# Patient Record
Sex: Female | Born: 1975 | Race: White | Hispanic: No | Marital: Married | Smoking: Former smoker
Health system: Southern US, Community
[De-identification: ages and names within clinical notes are randomized; demographics above are authoritative.]

## PROBLEM LIST (undated history)

## (undated) DIAGNOSIS — K219 Gastro-esophageal reflux disease without esophagitis: Secondary | ICD-10-CM

## (undated) DIAGNOSIS — IMO0001 Reserved for inherently not codable concepts without codable children: Secondary | ICD-10-CM

## (undated) DIAGNOSIS — F32A Depression, unspecified: Secondary | ICD-10-CM

## (undated) DIAGNOSIS — M1990 Unspecified osteoarthritis, unspecified site: Secondary | ICD-10-CM

## (undated) DIAGNOSIS — R519 Headache, unspecified: Secondary | ICD-10-CM

## (undated) DIAGNOSIS — F329 Major depressive disorder, single episode, unspecified: Secondary | ICD-10-CM

## (undated) HISTORY — DX: Reserved for inherently not codable concepts without codable children: IMO0001

## (undated) HISTORY — PX: HERNIA REPAIR: SHX51

## (undated) HISTORY — DX: Gastro-esophageal reflux disease without esophagitis: K21.9

## (undated) HISTORY — DX: Major depressive disorder, single episode, unspecified: F32.9

## (undated) HISTORY — DX: Depression, unspecified: F32.A

---

## 2004-09-12 ENCOUNTER — Other Ambulatory Visit: Admission: RE | Admit: 2004-09-12 | Discharge: 2004-09-12 | Payer: Self-pay | Admitting: Obstetrics and Gynecology

## 2005-09-28 ENCOUNTER — Inpatient Hospital Stay (HOSPITAL_COMMUNITY): Admission: AD | Admit: 2005-09-28 | Discharge: 2005-09-30 | Payer: Self-pay | Admitting: Obstetrics & Gynecology

## 2006-08-16 ENCOUNTER — Emergency Department (HOSPITAL_COMMUNITY): Admission: EM | Admit: 2006-08-16 | Discharge: 2006-08-16 | Payer: Self-pay | Admitting: Emergency Medicine

## 2007-02-14 ENCOUNTER — Encounter (INDEPENDENT_AMBULATORY_CARE_PROVIDER_SITE_OTHER): Payer: Self-pay | Admitting: Obstetrics and Gynecology

## 2007-02-14 ENCOUNTER — Inpatient Hospital Stay (HOSPITAL_COMMUNITY)
Admission: RE | Admit: 2007-02-14 | Discharge: 2007-02-17 | Payer: BLUE CROSS/BLUE SHIELD | Admitting: Obstetrics and Gynecology

## 2010-11-18 NOTE — Op Note (Signed)
Tiffany Chambers, Tiffany Chambers           ACCOUNT NO.:  [id number]   MEDICAL RECORD NO.:  [id number]          PATIENT TYPE:  INP   LOCATION:  9129                          FACILITY:  [facility]   PHYSICIAN:  Carrington Clamp, M.D. DATE OF BIRTH:  [birth year]   DATE OF PROCEDURE:  02/14/2007  DATE OF DISCHARGE:                               OPERATIVE REPORT   PREOPERATIVE DIAGNOSES:  Repeat cesarean section at term and  multiparous, desires permanent sterility.   POSTOPERATIVE DIAGNOSES:  Repeat cesarean section at term and  multiparous, desires permanent sterility.   PROCEDURE:  Repeat low transverse cesarean section with bilateral tubal  ligation.   SURGEON:  Carrington Clamp, M.D.   ASSISTANT:  Ilda Mori, M.D.   ANESTHESIA:  Spinal.   FINDINGS:  Female infant, vertex presentation, 7 pounds 1 ounce, Apgar's 6  and 7. The baby went to nursery for some transient tachypnea as a  newborn and some fluid. Specimens are left tubal segment and right  fimbriated end of the tube to pathology. Findings were as expected from  previous surgery. Complete uterine didelphys. The left horn of the  uterus was once again pregnant. The right horn was atrophied. The baby  was in the vertex presentation. The left tube was normal in appearance,  the right tube was atrophied and it was difficult to follow the course  back into the cornea of the right horn. The fimbriated end however was  able to be identified and about 4 cm of this was able to be removed. The  ovaries were otherwise normal bilaterally. Kidneys were palpated  bilaterally.   ESTIMATED BLOOD LOSS:  600 mL.   IV FLUIDS:  3500 mL.   URINE OUTPUT:  3000 mL.   COMPLICATIONS:  None.   TECHNIQUE:  After adequate spinal anesthesia was achieved, the patient  was prepped and draped in the usual sterile fashion, dorsal supine  position with leftward tilt. A Pfannenstiel skin incision was made with  the scalpel and carried down to the fascia with  the bovie cautery. The  fascia was incised in the midline with the scalpel and carried in a  transverse curvilinear manner with the Mayo scissors. The fascia was  retracted superior and inferiorly from the rectus muscles with sharp  dissection with the scalpel and the Mayo scissors. The midline was  identified and incised in a vertical fashion with the scalpel until most  of the scar tissue had been gotten through. A hemostat was then used to  carefully and bluntly dissect the peritoneum open. The peritoneum was  incised in a superior and inferior manner with the Mayo scissors. This  was done with good visualization of the bowel and the bladder.   The bladder blade was placed, the vesicouterine fascia tented up and  incised in a transverse curvilinear manner with the Metzenbaum scissors.  A bladder flap was created bluntly and retracted out of the way with the  bladder blade. A 2-cm incision was made in the upper portion of the  lower uterine segment of the left horn until the amnion was identified.  The incision was extended with blunt  dissection and the amnion ruptured.  Clear fluid was noted upon entry and the baby was delivered through the  incision without complications. The baby was bulb suctioned on the field  and the cord was clamped and cut. The baby was handed to awaiting  pediatrics. The baby had been bulb suctioned before, the entire body has  been delivered as well.   The placenta was removed manually and the uterus exteriorized, wrapped  in wet lap, cleared of all debris. The incision was closed with a  running locked stitch of #0 Monocryl. This was followed by an  imbricating layer of #0 Monocryl. Attention was turned to the left hand  tube which was tented up with a Babcock and the avascular portion of the  mesosalpinx was entered into with the bovie cautery. Two free-hand ties  were tied down on each side of the tube creating an intervening segment  of approximately  3cm. The segment was then excised with Metzenbaum  scissors. On the right hand side, the right uterine horn was identified  and followed down through the ovary, the uterine vessels and to the  fimbriated end. There appeared to be an atrophic tube along the veins  but it was difficult to trace the tube from the cornea and to the  fimbriated end. It was then decided to remove the fimbriated end  approximately 4 cm by placing a Tresa Endo over this excising it with the  Metzenbaum's and placing two free hand ties of #0 plain gut. Both of  these segments were sent to pathology.   The uterus was reapproximated in the abdomen and the abdomen cleared of  all debris with irrigation. The uterine incision was reinspected and  found to be hemostatic. The peritoneum was closed with a running stitch  of 2-0 Vicryl. This incorporated the rectus muscles as a separate layer.  A couple of figure-of-eight stitches of 2-0 Vicryl were used to ensure  hemostasis of the rectus muscles. A small amount of oozing was noted  before closure but it appeared to be stable and under control. The  fascia was then closed with a running stitch of #0 Vicryl. The  subcutaneous tissue was rendered hemostatic with bovie cautery and  irrigation. The subcutaneous layer was closed with interrupted stitches  of 2-0 plain gut. The skin was closed with staples. A pressure bandage  was placed and the patient returned to the recovery room in stable  condition.      Carrington Clamp, M.D.  Electronically Signed     MH/MEDQ  D:  02/14/2007  T:  02/15/2007  Job:  [id number]

## 2010-11-21 NOTE — Op Note (Signed)
Tiffany Chambers, Tiffany Chambers           ACCOUNT NO.:  [id number]   MEDICAL RECORD NO.:  [id number]          PATIENT TYPE:  INP   LOCATION:  NA                            FACILITY:  [facility]   PHYSICIAN:  Carrington Clamp, M.D. DATE OF BIRTH:  January 15, 1976   DATE OF PROCEDURE:  09/28/2005  DATE OF DISCHARGE:                                 OPERATIVE REPORT   PREOPERATIVE DIAGNOSES:  1.  Term pregnancy.  2.  Desires repeat cesarean section.   POSTOPERATIVE DIAGNOSES:  1.  Term pregnancy.  2.  Desires repeat cesarean section.  3.  Breech presentation.  4.  Complete uterine didelphys.   PROCEDURE:  Low transverse cesarean section.   SURGEON:  Carrington Clamp, M.D.   ASSISTANT:  Luvenia Redden, M.D.   ANESTHESIA:  Spinal.   SPECIMENS:  None.   ESTIMATED BLOOD LOSS:  750 ml.   IV FLUIDS:  2900 mL.   URINE OUTPUT:  150 mL.   COMPLICATIONS:  None.   FINDINGS:  A female infant, 6 pounds 11 ounces, I believe, with Apgars 9 and  9, in the breech presentation.  There was a complete uterine didelphys with  apparently to cervices felt but not confirmed.  The baby was located in the  left of the uterus, and the right horn appeared to be normal otherwise.  There wee normal ovaries and tubes seen.   MEDICATIONS:  Pitocin and Ancef.   COUNTS:  Correct x3.   TECHNIQUE:  After adequate spinal anesthesia was achieved, the patient is  prepped and draped in the usual sterile fashion in dorsal supine position  with leftward tilt.  A Pfannenstiel skin incision was made with the scalpel,  carried down to the fascia with the Bovie cautery.  The fascia was incised  in the midline with the scalpel and then carried in a transverse curvilinear  manner with the Mayo scissors.  The fascia was reflected carefully  superiorly and inferiorly from the rectus muscles and the rectus muscles  split in the midline with careful dissection with the scalpel.  Once the  peritoneum was identified, it was tented up  and entered into with the  Metzenbaum scissors with good visualization of bowel and the bladder.  The  peritoneum was then opened with good visualization of the bowel and the  bladder with blunt and sharp dissection and the bladder blade placed.  The  vesicouterine fascia was tented up and incised in a transverse curvilinear  manner with the Metzenbaum scissors and the bladder flap created with blunt  dissection and bladder blade replaced.   A 2 cm transverse incision was made in the upper portion lower uterine  segment until clear fluid was noted upon entry.  The bandage scissors were  used to extend the incision bilaterally, although there did not appear to be  as much room there usually is at term in the lower uterine segment.  The  baby was identified in the breech presentation and one leg was delivered,  followed by the of the leg delivered through the incision, and then the baby  delivered in the breech presentation in  the routine fashion.  The baby was  bulb-suctioned, the cord was clamped and cut and the baby was handed to  awaiting pediatrics.  The placenta was then manually removed.  The uterus  was exteriorized, wrapped in a wet lap, and cleared of all debris.   It was at this point that it was noted that although there was a normal tube  and ovary at the top of the fundus on the left-hand side, the right-hand  side of the fundus appeared to be smooth and slightly misshapen.  After  careful inspection, the second uterine horn, which was much smaller, about  the size of a small lime, was noted on the right-hand side and the ovary and  tube coming off of the right ovary and tube coming off of this horn.  It was  difficult to see if there was a connection at the top of the right horn  fundus to the left, but there was a separate lower uterine segment and  cervical length felt down inferiorly on the right-hand side.  Although an  external cervix could not be palpated, it was  believed that this horn has  its own cervix.  The uterine incision was closed with a running locked  stitch of 0 Monocryl.  This was followed by an imbricating layer.  Pictures  were then taken of the uterine didelphys and then the uteri replaced in the  abdominal cavity and the gutters cleared of all debris.  The uterine  incision was found to be hemostatic and all instruments were withdrawn from  the abdomen.   The peritoneum was closed with a running stitch of 2-0 Vicryl.  The fascia  was closed with a running stitch of - Vicryl.  The subcutaneous tissue was  rendered hemostatic with Bovie cautery and irrigation.  This layer was then  closed with interrupted stitches of 2-0 plain gut.  The skin was closed with  staples.  The patient was informed of the findings of the uterine didelphys  and understood.  The patient tolerated the procedure well, was returned to  the recovery room in stable condition.      Carrington Clamp, M.D.  Electronically Signed     MH/MEDQ  D:  09/28/2005  T:  09/29/2005  Job:  [id number]

## 2010-11-21 NOTE — Discharge Summary (Signed)
Tiffany Chambers, Tiffany Chambers           ACCOUNT NO.:  [id number]   MEDICAL RECORD NO.:  [id number]          PATIENT TYPE:  INP   LOCATION:  9111                          FACILITY:  [facility]   PHYSICIAN:  Carrington Clamp, M.D. DATE OF BIRTH:  1976-02-11   DATE OF ADMISSION:  09/28/2005  DATE OF DISCHARGE:  09/30/2005                                 DISCHARGE SUMMARY   FINAL DIAGNOSIS:  Intrauterine pregnancy at term, history of prior cesarean  section.  The patient desires repeat cesarean section, breech presentation,  and complete uterine didelphys.   PROCEDURE:  Low transverse cesarean section. Surgeon, Dr. Carrington Clamp;  assistant, Dr. Lodema Hong.  Complications none.   This 35 year old, G2, P1 presents at term for repeat cesarean section.  The  patient's antepartum course up to this point had been complicated by a low  lying placenta which resolved during her pregnancy.  She was a smoker that  quit with pregnancy.  The patient also had a history of LEEP and a history  of C-section secondary to breech presentation.  The patient also has a  history of depression and she was on Celexa throughout her pregnancy and is  not having any complications.  The patient also had a positive group B strep  culture obtained in the office at 36 weeks.  She is admitted at this time  for repeat cesarean section. She is taken to the operating room on September 28, 2005 by Dr. Carrington Clamp where a repeat low transverse cesarean section  was performed with the delivery of a 6 pound 11 ounces female infant with  Apgar's of 9 and 9.  The baby was in breech presentation at this time and  there was a complete uterine didelphys with apparently two cervices felt but  not confirmed. The baby was in the left uterus and the right one appeared to  be normal otherwise. The patient was given Ancef during the cesarean  section. The rest of the delivery wen without complications.  The patient's  postoperative course was  benign without any significant fevers.  She was  felt ready for discharge on postoperative day #2 and was sent home on a  regular diet, told to decrease activities, told to continue her prenatal  vitamins and an iron supplement was given, Tylox #25 1-2 every 4 hours as  needed for pain. She was to follow up with Dr. Henderson Cloud in the office in 1  week for an incision check, of course to call with increased pain, fever or  bleeding.   LABS ON DISCHARGE:  The patient had a hemoglobin of 9.6, white blood cell  count of 9.4 and platelets of 261,000.      Leilani Able, P.A.-C.      Carrington Clamp, M.D.  Electronically Signed    MB/MEDQ  D:  10/29/2005  T:  10/30/2005  Job:  [id number]

## 2010-11-21 NOTE — Discharge Summary (Signed)
Tiffany Chambers, Tiffany Chambers           ACCOUNT NO.:  [id number]   MEDICAL RECORD NO.:  [id number]          PATIENT TYPE:  INP   LOCATION:  9129                          FACILITY:  [facility]   PHYSICIAN:  Randye Lobo, M.D.   DATE OF BIRTH:  February 11, 1976   DATE OF ADMISSION:  02/14/2007  DATE OF DISCHARGE:  02/17/2007                               DISCHARGE SUMMARY   FINAL DIAGNOSIS:  1. Intrauterine pregnancy at term.  2. History of prior cesarean section.  The patient desires repeat      cesarean section, and the patient desires permanent sterilization.   PROCEDURE:  Repeat low transverse cesarean section with bilateral tubal  ligation.   SURGEON:  Dr. Carrington Clamp.   ASSISTANT:  Dr. Ilda Mori.   COMPLICATIONS:  None.   This 35 year old G3, P 2-0-0-2 presents at term for her repeat cesarean  section.  The patient's antepartum course up to this point had been  complicated by a known complete uterine didelphys.  The patient also has  a history of depression, and she has been on Celexa and was stable  throughout her pregnancy.  She is also on Nexium for some  gastroesophageal reflux.  The patient also had a positive group B strep  culture that was noted in our office at the end of her pregnancy and had  had two prior cesarean sections, secondary to breech presentation.  The  patient was admitted at this time on February 14, 2007, where repeat low  transverse cesarean section was performed with the delivery of a 7-pound  1-ounce female infant with Apgars of 6 and 7.  The baby was taken to the  NICU, was having some tachypnea and some fluid.  After the baby was  taken, bilateral tubal ligation was performed.  The complete uterine  didelphys was noted, and the tubal ligation went without complication.  The patient's postoperative course was benign, without any significant  fevers.  The baby was in the NICU during this time.  The patient was  started on some iron for some mild  postoperative anemia.  The patient  was felt ready for discharge on postoperative day #2.  She did want her  little boy circumcised before discharge.  She was sent home on a regular  diet, told to decrease activities, told to continue her prenatal  vitamins and her iron supplement twice daily.  Was given Percocet one to  two every 4 to 6 hours as needed for pain.  Was told to use ibuprofen up  to 600 mg every 6 hours as needed for pain.  Was to follow up in 2 weeks  for an incision check.   LABS ON DISCHARGE:  The patient had a hemoglobin of 8.7, white blood  cell count of 24.1 on August 12th.  I do not see any other lab work.      Leilani Able, P.A.-C.      Randye Lobo, M.D.  Electronically Signed    MB/MEDQ  D:  03/18/2007  T:  03/19/2007  Job:  [id number]

## 2011-04-20 LAB — CBC
HCT: 24.1 — ABNORMAL LOW
HCT: 27.9 — ABNORMAL LOW
HCT: 32 — ABNORMAL LOW
Hemoglobin: 11.1 — ABNORMAL LOW
Hemoglobin: 8.7 — ABNORMAL LOW
Hemoglobin: 9.7 — ABNORMAL LOW
MCHC: 34.6
MCHC: 34.8
MCHC: 36.2 — ABNORMAL HIGH
MCV: 90.9
MCV: 92
MCV: 92.7
Platelets: 210
Platelets: 240
Platelets: 281
RBC: 2.65 — ABNORMAL LOW
RBC: 3.01 — ABNORMAL LOW
RBC: 3.47 — ABNORMAL LOW
RDW: 12.8
RDW: 12.9
RDW: 13.3
WBC: 10.5
WBC: 8.8
WBC: 8.8

## 2011-04-20 LAB — RAPID HIV SCREEN (WH-MAU): Rapid HIV Screen: NONREACTIVE

## 2011-04-20 LAB — RPR: RPR Ser Ql: NONREACTIVE

## 2013-05-24 ENCOUNTER — Telehealth: Payer: Self-pay | Admitting: *Deleted

## 2013-05-24 ENCOUNTER — Encounter: Payer: Self-pay | Admitting: Podiatry

## 2013-05-24 ENCOUNTER — Ambulatory Visit (INDEPENDENT_AMBULATORY_CARE_PROVIDER_SITE_OTHER): Payer: BC Managed Care – PPO | Admitting: Podiatry

## 2013-05-24 ENCOUNTER — Ambulatory Visit (INDEPENDENT_AMBULATORY_CARE_PROVIDER_SITE_OTHER): Payer: BC Managed Care – PPO

## 2013-05-24 VITALS — BP 123/80 | HR 99 | Resp 16 | Ht 64.0 in | Wt 235.0 lb

## 2013-05-24 DIAGNOSIS — M79672 Pain in left foot: Secondary | ICD-10-CM

## 2013-05-24 DIAGNOSIS — M79609 Pain in unspecified limb: Secondary | ICD-10-CM

## 2013-05-24 DIAGNOSIS — M722 Plantar fascial fibromatosis: Secondary | ICD-10-CM

## 2013-05-24 MED ORDER — METHYLPREDNISOLONE (PAK) 4 MG PO TABS: ORAL_TABLET | ORAL | Status: DC

## 2013-05-24 MED ORDER — MELOXICAM 15 MG PO TABS: 15.0000 mg | ORAL_TABLET | Freq: Every day | ORAL | Status: DC

## 2013-05-24 NOTE — Progress Notes (Signed)
  Subjective:    Patient ID: Tiffany Chambers, female    DOB: 1975/07/15, 37 y.o.   MRN: [id number]  HPI Comments: N sharp pains   L left plantar heel  D 2 months or more  O gradual  C worse  A in the morning , and at night  T no treatment   Foot Pain      Review of Systems  Musculoskeletal: Positive for back pain.       Difficulty walking   All other systems reviewed and are negative.       Objective:   Physical Exam: I have reviewed her past medical history medications and allergies. Vital signs are stable she is alert and oriented x3 review of systems unremarkable other than back pain. Lower extremity exam reveals strong palpable pulses bilateral neurologic sensorium is intact bilateral. Deep tendon reflexes are brisk and intact bilateral. Muscle strength +5 over 5 dorsiflexors plantar flexors inverters and evertors. All intrinsic musculature is intact. Orthopedic evaluation Mr. is all joints distal to the ankle a full range of motion without crepitation. She does have pain on palpation to the medial calcaneal tubercle of the left heel. No pain on medial lateral compression of the calcaneus. Radiographic evaluation does demonstrate a soft tissue increase in density at the plantar fascial calcaneal insertion site left. No other osseous abnormalities noted. Cutaneous evaluation demonstrates supple well hydrated cutis no erythema edema saline is drainage or odor.        Assessment & Plan:  Assessment: Plantar fasciitis left foot  Plan: We discussed the etiology pathology conservative versus surgical therapies. I injected her left heel with Kenalog and local anesthetic. A plantar fascial strapping was applied. She was dispensed a Cam Walker and a prescription for Medrol Dosepak to be followed by Mobic. She was given both oral and written home-going instructions as well as stretching instructions. Appropriate shoe gear stretching exercises ice therapy and shoe gear modifications.  I will followup with her in one month.

## 2013-05-24 NOTE — Patient Instructions (Signed)
Plantar Fasciitis (Heel Spur Syndrome) with Rehab The plantar fascia is a fibrous, ligament-like, soft-tissue structure that spans the bottom of the foot. Plantar fasciitis is a condition that causes pain in the foot due to inflammation of the tissue. SYMPTOMS   Pain and tenderness on the underneath side of the foot.  Pain that worsens with standing or walking. CAUSES  Plantar fasciitis is caused by irritation and injury to the plantar fascia on the underneath side of the foot. Common mechanisms of injury include:  Direct trauma to bottom of the foot.  Damage to a small nerve that runs under the foot where the main fascia attaches to the heel bone.  Stress placed on the plantar fascia due to bone spurs. RISK INCREASES WITH:   Activities that place stress on the plantar fascia (running, jumping, pivoting, or cutting).  Poor strength and flexibility.  Improperly fitted shoes.  Tight calf muscles.  Flat feet.  Failure to warm-up properly before activity.  Obesity. PREVENTION  Warm up and stretch properly before activity.  Allow for adequate recovery between workouts.  Maintain physical fitness:  Strength, flexibility, and endurance.  Cardiovascular fitness.  Maintain a health body weight.  Avoid stress on the plantar fascia.  Wear properly fitted shoes, including arch supports for individuals who have flat feet. PROGNOSIS  If treated properly, then the symptoms of plantar fasciitis usually resolve without surgery. However, occasionally surgery is necessary. RELATED COMPLICATIONS   Recurrent symptoms that may result in a chronic condition.  Problems of the lower back that are caused by compensating for the injury, such as limping.  Pain or weakness of the foot during push-off following surgery.  Chronic inflammation, scarring, and partial or complete fascia tear, occurring more often from repeated injections. TREATMENT  Treatment initially involves the use of  ice and medication to help reduce pain and inflammation. The use of strengthening and stretching exercises may help reduce pain with activity, especially stretches of the Achilles tendon. These exercises may be performed at home or with a therapist. Your caregiver may recommend that you use heel cups of arch supports to help reduce stress on the plantar fascia. Occasionally, corticosteroid injections are given to reduce inflammation. If symptoms persist for greater than 6 months despite non-surgical (conservative), then surgery may be recommended.  MEDICATION   If pain medication is necessary, then nonsteroidal anti-inflammatory medications, such as aspirin and ibuprofen, or other minor pain relievers, such as acetaminophen, are often recommended.  Do not take pain medication within 7 days before surgery.  Prescription pain relievers may be given if deemed necessary by your caregiver. Use only as directed and only as much as you need.  Corticosteroid injections may be given by your caregiver. These injections should be reserved for the most serious cases, because they may only be given a certain number of times. HEAT AND COLD  Cold treatment (icing) relieves pain and reduces inflammation. Cold treatment should be applied for 10 to 15 minutes every 2 to 3 hours for inflammation and pain and immediately after any activity that aggravates your symptoms. Use ice packs or massage the area with a piece of ice (ice massage).  Heat treatment may be used prior to performing the stretching and strengthening activities prescribed by your caregiver, physical therapist, or athletic trainer. Use a heat pack or soak the injury in warm water. SEEK IMMEDIATE MEDICAL CARE IF:  Treatment seems to offer no benefit, or the condition worsens.  Any medications produce adverse side effects. EXERCISES RANGE   OF MOTION (ROM) AND STRETCHING EXERCISES - Plantar Fasciitis (Heel Spur Syndrome) These exercises may help you  when beginning to rehabilitate your injury. Your symptoms may resolve with or without further involvement from your physician, physical therapist or athletic trainer. While completing these exercises, remember:   Restoring tissue flexibility helps normal motion to return to the joints. This allows healthier, less painful movement and activity.  An effective stretch should be held for at least 30 seconds.  A stretch should never be painful. You should only feel a gentle lengthening or release in the stretched tissue. RANGE OF MOTION - Toe Extension, Flexion  Sit with your right / left leg crossed over your opposite knee.  Grasp your toes and gently pull them back toward the top of your foot. You should feel a stretch on the bottom of your toes and/or foot.  Hold this stretch for __________ seconds.  Now, gently pull your toes toward the bottom of your foot. You should feel a stretch on the top of your toes and or foot.  Hold this stretch for __________ seconds. Repeat __________ times. Complete this stretch __________ times per day.  RANGE OF MOTION - Ankle Dorsiflexion, Active Assisted  Remove shoes and sit on a chair that is preferably not on a carpeted surface.  Place right / left foot under knee. Extend your opposite leg for support.  Keeping your heel down, slide your right / left foot back toward the chair until you feel a stretch at your ankle or calf. If you do not feel a stretch, slide your bottom forward to the edge of the chair, while still keeping your heel down.  Hold this stretch for __________ seconds. Repeat __________ times. Complete this stretch __________ times per day.  STRETCH  Gastroc, Standing  Place hands on wall.  Extend right / left leg, keeping the front knee somewhat bent.  Slightly point your toes inward on your back foot.  Keeping your right / left heel on the floor and your knee straight, shift your weight toward the wall, not allowing your back to  arch.  You should feel a gentle stretch in the right / left calf. Hold this position for __________ seconds. Repeat __________ times. Complete this stretch __________ times per day. STRETCH  Soleus, Standing  Place hands on wall.  Extend right / left leg, keeping the other knee somewhat bent.  Slightly point your toes inward on your back foot.  Keep your right / left heel on the floor, bend your back knee, and slightly shift your weight over the back leg so that you feel a gentle stretch deep in your back calf.  Hold this position for __________ seconds. Repeat __________ times. Complete this stretch __________ times per day. STRETCH  Gastrocsoleus, Standing  Note: This exercise can place a lot of stress on your foot and ankle. Please complete this exercise only if specifically instructed by your caregiver.   Place the ball of your right / left foot on a step, keeping your other foot firmly on the same step.  Hold on to the wall or a rail for balance.  Slowly lift your other foot, allowing your body weight to press your heel down over the edge of the step.  You should feel a stretch in your right / left calf.  Hold this position for __________ seconds.  Repeat this exercise with a slight bend in your right / left knee. Repeat __________ times. Complete this stretch __________ times per day.    STRENGTHENING EXERCISES - Plantar Fasciitis (Heel Spur Syndrome)  These exercises may help you when beginning to rehabilitate your injury. They may resolve your symptoms with or without further involvement from your physician, physical therapist or athletic trainer. While completing these exercises, remember:   Muscles can gain both the endurance and the strength needed for everyday activities through controlled exercises.  Complete these exercises as instructed by your physician, physical therapist or athletic trainer. Progress the resistance and repetitions only as guided. STRENGTH - Towel  Curls  Sit in a chair positioned on a non-carpeted surface.  Place your foot on a towel, keeping your heel on the floor.  Pull the towel toward your heel by only curling your toes. Keep your heel on the floor.  If instructed by your physician, physical therapist or athletic trainer, add ____________________ at the end of the towel. Repeat __________ times. Complete this exercise __________ times per day. STRENGTH - Ankle Inversion  Secure one end of a rubber exercise band/tubing to a fixed object (table, pole). Loop the other end around your foot just before your toes.  Place your fists between your knees. This will focus your strengthening at your ankle.  Slowly, pull your big toe up and in, making sure the band/tubing is positioned to resist the entire motion.  Hold this position for __________ seconds.  Have your muscles resist the band/tubing as it slowly pulls your foot back to the starting position. Repeat __________ times. Complete this exercises __________ times per day.  Document Released: 06/22/2005 Document Revised: 09/14/2011 Document Reviewed: 10/04/2008 [employer] Patient Information 2014 [employer]. Plantar Fasciitis Plantar fasciitis is a common condition that causes foot pain. It is soreness (inflammation) of the band of tough fibrous tissue on the bottom of the foot that runs from the heel bone (calcaneus) to the ball of the foot. The cause of this soreness may be from excessive standing, poor fitting shoes, running on hard surfaces, being overweight, having an abnormal walk, or overuse (this is common in runners) of the painful foot or feet. It is also common in aerobic exercise dancers and ballet dancers. SYMPTOMS  Most people with plantar fasciitis complain of:  Severe pain in the morning on the bottom of their foot especially when taking the first steps out of bed. This pain recedes after a few minutes of walking.  Severe pain is experienced also during walking  following a long period of inactivity.  Pain is worse when walking barefoot or up stairs DIAGNOSIS   Your caregiver will diagnose this condition by examining and feeling your foot.  Special tests such as X-rays of your foot, are usually not needed. PREVENTION   Consult a sports medicine professional before beginning a new exercise program.  Walking programs offer a good workout. With walking there is a lower chance of overuse injuries common to runners. There is less impact and less jarring of the joints.  Begin all new exercise programs slowly. If problems or pain develop, decrease the amount of time or distance until you are at a comfortable level.  Wear good shoes and replace them regularly.  Stretch your foot and the heel cords at the back of the ankle (Achilles tendon) both before and after exercise.  Run or exercise on even surfaces that are not hard. For example, asphalt is better than pavement.  Do not run barefoot on hard surfaces.  If using a treadmill, vary the incline.  Do not continue to workout if you have foot or joint   problems. Seek professional help if they do not improve. HOME CARE INSTRUCTIONS   Avoid activities that cause you pain until you recover.  Use ice or cold packs on the problem or painful areas after working out.  Only take over-the-counter or prescription medicines for pain, discomfort, or fever as directed by your caregiver.  Soft shoe inserts or athletic shoes with air or gel sole cushions may be helpful.  If problems continue or become more severe, consult a sports medicine caregiver or your own health care provider. Cortisone is a potent anti-inflammatory medication that may be injected into the painful area. You can discuss this treatment with your caregiver. MAKE SURE YOU:   Understand these instructions.  Will watch your condition.  Will get help right away if you are not doing well or get worse. Document Released: 03/17/2001 Document  Revised: 09/14/2011 Document Reviewed: 05/16/2008 [employer] Patient Information 2014 [employer].  

## 2013-05-25 NOTE — Telephone Encounter (Signed)
OPENED IN ERROR

## 2013-06-21 ENCOUNTER — Ambulatory Visit: Payer: BC Managed Care – PPO | Admitting: Podiatry

## 2013-06-21 ENCOUNTER — Ambulatory Visit (INDEPENDENT_AMBULATORY_CARE_PROVIDER_SITE_OTHER): Payer: BC Managed Care – PPO | Admitting: Podiatry

## 2013-06-21 VITALS — BP 124/74 | HR 70 | Resp 16

## 2013-06-21 DIAGNOSIS — M722 Plantar fascial fibromatosis: Secondary | ICD-10-CM

## 2013-06-21 NOTE — Progress Notes (Signed)
   Subjective:    Patient ID: Tiffany Chambers, female    DOB: June 03, 1976, 37 y.o.   MRN: [id number]  HPI Comments: Its doing great, its like a whole new foot, 98% better      Review of Systems     Objective:   Physical Exam: I reviewed her past medical history medications allergies. Vital signs are stable she is alert and oriented x3. Pulses are palpable bilateral foot. No pain on palpation medial continued tubercle.        Assessment & Plan:  Assessment: Well-healing plantar fasciitis right.  Plan: Continue all conservative therapies x1 month followup with me as needed.

## 2013-10-11 ENCOUNTER — Other Ambulatory Visit: Payer: Self-pay | Admitting: Obstetrics and Gynecology

## 2014-10-18 ENCOUNTER — Other Ambulatory Visit: Payer: Self-pay | Admitting: Obstetrics and Gynecology

## 2014-10-19 LAB — CYTOLOGY - PAP

## 2014-10-23 ENCOUNTER — Other Ambulatory Visit: Payer: Self-pay | Admitting: Obstetrics and Gynecology

## 2014-10-23 DIAGNOSIS — R928 Other abnormal and inconclusive findings on diagnostic imaging of breast: Secondary | ICD-10-CM

## 2014-10-29 ENCOUNTER — Ambulatory Visit
Admission: RE | Admit: 2014-10-29 | Discharge: 2014-10-29 | Disposition: A | Discharge status: Home / Self Care | Payer: BLUE CROSS/BLUE SHIELD | Source: Ambulatory Visit | Attending: Obstetrics and Gynecology | Admitting: Obstetrics and Gynecology

## 2014-10-29 DIAGNOSIS — R928 Other abnormal and inconclusive findings on diagnostic imaging of breast: Secondary | ICD-10-CM

## 2014-11-07 ENCOUNTER — Other Ambulatory Visit: Payer: Self-pay | Admitting: Obstetrics and Gynecology

## 2015-03-29 ENCOUNTER — Other Ambulatory Visit: Payer: Self-pay | Admitting: Obstetrics and Gynecology

## 2015-03-29 DIAGNOSIS — N6310 Unspecified lump in the right breast, unspecified quadrant: Secondary | ICD-10-CM

## 2015-04-09 ENCOUNTER — Other Ambulatory Visit: Payer: Self-pay | Admitting: Obstetrics and Gynecology

## 2015-04-09 DIAGNOSIS — N6310 Unspecified lump in the right breast, unspecified quadrant: Secondary | ICD-10-CM

## 2015-05-09 ENCOUNTER — Other Ambulatory Visit: Payer: BLUE CROSS/BLUE SHIELD

## 2015-05-09 ENCOUNTER — Ambulatory Visit
Admission: RE | Admit: 2015-05-09 | Discharge: 2015-05-09 | Disposition: A | Discharge status: Home / Self Care | Payer: BLUE CROSS/BLUE SHIELD | Source: Ambulatory Visit | Attending: Obstetrics and Gynecology | Admitting: Obstetrics and Gynecology

## 2015-05-09 ENCOUNTER — Other Ambulatory Visit: Payer: Self-pay | Admitting: Obstetrics and Gynecology

## 2015-05-09 DIAGNOSIS — N6310 Unspecified lump in the right breast, unspecified quadrant: Secondary | ICD-10-CM

## 2015-05-10 LAB — CYTOLOGY - PAP

## 2016-01-09 ENCOUNTER — Other Ambulatory Visit: Payer: Self-pay | Admitting: Obstetrics and Gynecology

## 2016-01-13 LAB — CYTOLOGY - PAP

## 2016-08-07 ENCOUNTER — Encounter: Payer: Self-pay | Admitting: Podiatry

## 2016-08-07 ENCOUNTER — Ambulatory Visit (INDEPENDENT_AMBULATORY_CARE_PROVIDER_SITE_OTHER): Payer: Managed Care, Other (non HMO)

## 2016-08-07 ENCOUNTER — Ambulatory Visit (INDEPENDENT_AMBULATORY_CARE_PROVIDER_SITE_OTHER): Payer: Managed Care, Other (non HMO) | Admitting: Podiatry

## 2016-08-07 DIAGNOSIS — M2011 Hallux valgus (acquired), right foot: Secondary | ICD-10-CM

## 2016-08-07 DIAGNOSIS — R52 Pain, unspecified: Secondary | ICD-10-CM

## 2016-08-07 DIAGNOSIS — M79673 Pain in unspecified foot: Secondary | ICD-10-CM

## 2016-08-07 DIAGNOSIS — M79672 Pain in left foot: Secondary | ICD-10-CM

## 2016-08-07 DIAGNOSIS — M722 Plantar fascial fibromatosis: Secondary | ICD-10-CM

## 2016-08-07 DIAGNOSIS — M7662 Achilles tendinitis, left leg: Secondary | ICD-10-CM

## 2016-08-07 DIAGNOSIS — M7661 Achilles tendinitis, right leg: Secondary | ICD-10-CM | POA: Diagnosis not present

## 2016-08-07 DIAGNOSIS — M21611 Bunion of right foot: Secondary | ICD-10-CM

## 2016-08-07 MED ORDER — MELOXICAM 15 MG PO TABS: 15.0000 mg | ORAL_TABLET | Freq: Every day | ORAL | 1 refills | Status: AC

## 2016-08-07 MED ORDER — METHYLPREDNISOLONE 4 MG PO TBPK: ORAL_TABLET | ORAL | 0 refills | Status: DC

## 2016-08-07 NOTE — Progress Notes (Signed)
   Subjective:    Patient ID: Tiffany HerrlichElizabeth K Schuff, female    DOB: Dec 10, 1975, 41 y.o.   MRN: [id number]  HPI    Review of Systems  Musculoskeletal: Positive for gait problem.  All other systems reviewed and are negative.      Objective:   Physical Exam        Assessment & Plan:

## 2016-08-10 ENCOUNTER — Other Ambulatory Visit: Payer: Self-pay | Admitting: Podiatry

## 2016-08-10 DIAGNOSIS — R52 Pain, unspecified: Secondary | ICD-10-CM

## 2016-08-16 MED ORDER — BETAMETHASONE SOD PHOS & ACET 6 (3-3) MG/ML IJ SUSP: 3.0000 mg | Freq: Once | INTRAMUSCULAR | Status: DC

## 2016-08-16 NOTE — Progress Notes (Signed)
Patient ID: Tiffany Chambers, female   DOB: 06/15/1976, 41 y.o.   MRN: [id number]   Subjective: Patient presents today for evaluation of multiple complaints regarding the bilateral lower extremities. Patient complains of heel pain bilateral as well as bunion pain to the right foot is going on for approximately 1 year now. Patient presents today for further treatment and evaluation.  Objective: Physical Exam General: The patient is alert and oriented x3 in no acute distress.  Dermatology: Skin is warm, dry and supple bilateral lower extremities. Negative for open lesions or macerations bilateral.   Vascular: Dorsalis Pedis and Posterior Tibial pulses palpable bilateral.  Capillary fill time is immediate to all digits.  Neurological: Epicritic and protective threshold intact bilateral.   Musculoskeletal: Tenderness to palpation at the medial calcaneal tubercale and through the insertion of the plantar fascia of the left foot.  Symptomatic bunion deformity also noted to the right foot with a prominent metatarsal head medial eminence. Pain on palpation also noted to the insertion of the Achilles tendon bilateral lower extremities consistent with a insertional Achilles tendinitis.  Radiographic exam:   Normal osseous mineralization. Joint spaces preserved. No fracture/dislocation/boney destruction. Calcaneal spur present with mild thickening of plantar fascia left. No other soft tissue abnormalities or radiopaque foreign bodies.  Increased intermetatarsal angle noted to the right foot consistent with hallux abductovalgus greater than 15. Hallux abductus angle greater than 30.  Assessment: 1. Plantar fasciitis left foot 2. Hallux abductovalgus with bunion deformity right 3. Insertional Achilles tendinitis bilateral  Plan of Care:   1. Patient evaluated. Xrays reviewed.   2. Injection of 0.5cc Celestone soluspan injected into the left plantar fascia.  3. Instructed patient regarding  therapies and modalities at home to alleviate symptoms.  4. Rx for meloxicam 15mg  PO given to patient.  5. Rx for Medrol Dosepak 6. Return to clinic in 4 weeks.     Felecia ShellingBrent M. Kento Gossman, DPM [facility]  Dr. Felecia ShellingBrent M. Merrik Puebla, DPM    [address]                                        [place], [place] [address]                Office [phone]  Fax [phone]

## 2016-08-27 ENCOUNTER — Ambulatory Visit (INDEPENDENT_AMBULATORY_CARE_PROVIDER_SITE_OTHER): Payer: Managed Care, Other (non HMO) | Admitting: Physician Assistant

## 2016-08-27 ENCOUNTER — Encounter: Payer: Self-pay | Admitting: Physician Assistant

## 2016-08-27 VITALS — BP 118/78 | HR 74 | Temp 98.2°F | Resp 16 | Wt 283.6 lb

## 2016-08-27 DIAGNOSIS — F324 Major depressive disorder, single episode, in partial remission: Secondary | ICD-10-CM

## 2016-08-27 DIAGNOSIS — Z23 Encounter for immunization: Secondary | ICD-10-CM | POA: Diagnosis not present

## 2016-08-27 DIAGNOSIS — F32A Depression, unspecified: Secondary | ICD-10-CM | POA: Insufficient documentation

## 2016-08-27 DIAGNOSIS — Z0000 Encounter for general adult medical examination without abnormal findings: Secondary | ICD-10-CM | POA: Diagnosis not present

## 2016-08-27 DIAGNOSIS — F329 Major depressive disorder, single episode, unspecified: Secondary | ICD-10-CM | POA: Insufficient documentation

## 2016-08-27 DIAGNOSIS — K219 Gastro-esophageal reflux disease without esophagitis: Secondary | ICD-10-CM | POA: Diagnosis not present

## 2016-08-27 LAB — CBC WITH DIFFERENTIAL/PLATELET
Basophils Absolute: 0 cells/uL (ref 0–200)
Basophils Relative: 0 %
Eosinophils Absolute: 213 cells/uL (ref 15–500)
Eosinophils Relative: 3 %
HCT: 35.9 % (ref 35.0–45.0)
Hemoglobin: 11.9 g/dL — ABNORMAL LOW (ref 12.0–15.0)
Lymphocytes Relative: 29 %
Lymphs Abs: 2059 cells/uL (ref 850–3900)
MCH: 29.8 pg (ref 27.0–33.0)
MCHC: 33.1 g/dL (ref 32.0–36.0)
MCV: 89.8 fL (ref 80.0–100.0)
MPV: 8.7 fL (ref 7.5–12.5)
Monocytes Absolute: 355 cells/uL (ref 200–950)
Monocytes Relative: 5 %
Neutro Abs: 4473 cells/uL (ref 1500–7800)
Neutrophils Relative %: 63 %
Platelets: 337 10*3/uL (ref 140–400)
RBC: 4 MIL/uL (ref 3.80–5.10)
RDW: 13.5 % (ref 11.0–15.0)
WBC: 7.1 10*3/uL (ref 3.8–10.8)

## 2016-08-27 LAB — COMPLETE METABOLIC PANEL WITH GFR
ALT: 8 U/L (ref 6–29)
AST: 12 U/L (ref 10–30)
Albumin: 3.6 g/dL (ref 3.6–5.1)
Alkaline Phosphatase: 53 U/L (ref 33–115)
BUN: 18 mg/dL (ref 7–25)
CO2: 23 mmol/L (ref 20–31)
Calcium: 8.9 mg/dL (ref 8.6–10.2)
Chloride: 104 mmol/L (ref 98–110)
Creat: 0.82 mg/dL (ref 0.50–1.10)
GFR, Est African American: >89 mL/min (ref >=60)
GFR, Est Non African American: 89 mL/min (ref >=60)
Glucose, Bld: 79 mg/dL (ref 70–99)
Potassium: 4.7 mmol/L (ref 3.5–5.3)
Sodium: 137 mmol/L (ref 135–146)
Total Bilirubin: 0.3 mg/dL (ref 0.2–1.2)
Total Protein: 6.6 g/dL (ref 6.1–8.1)

## 2016-08-27 LAB — TSH: TSH: 1.8 mIU/L

## 2016-08-27 LAB — LIPID PANEL
Cholesterol: 226 mg/dL — ABNORMAL HIGH (ref <200)
HDL: 83 mg/dL (ref >50)
LDL Cholesterol: 100 mg/dL — ABNORMAL HIGH (ref <100)
Total CHOL/HDL Ratio: 2.7 Ratio (ref <5.0)
Triglycerides: 215 mg/dL — ABNORMAL HIGH (ref <150)
VLDL: 43 mg/dL — ABNORMAL HIGH (ref <30)

## 2016-08-27 NOTE — Addendum Note (Signed)
Addended by: Phineas SemenJOHNSON, Phoenyx Paulsen A on: 08/27/2016 03:05 PM   Modules accepted: Orders

## 2016-08-27 NOTE — Progress Notes (Signed)
Patient ID: Tiffany Chambers MRN: [id number], DOB: 12-05-1975, 41 y.o. Date of Encounter: 08/27/2016,   Chief Complaint: Physical (CPE)  HPI: 41 y.o. y/o female  here for CPE.   As well she is a new patient to our office. She has no specific complaints or concerns today. She is getting over a cold. Has had a little bit of nasal congestion and congestion in her ears. No other symptoms with this. She does see a gynecologist. They prescribe the Celexa. She states that she has been on some type of medicine similar to this since she was 17. Says that she guesses would classify as depression even though it really was more anger symptoms in the past. Says that  GYN was also prescribing the Nexium but she now just gets that over-the-counter. Says that podiatry is prescribing the meloxicam.  Review of Systems: Consitutional: No fever, chills, fatigue, night sweats, lymphadenopathy. No significant/unexplained weight changes. Eyes: No visual changes, eye redness, or discharge. ENT/Mouth: No ear pain, sore throat,  or sinus pain. Cardiovascular: No chest pressure,heaviness, tightness or squeezing, even with exertion. No increased shortness of breath or dyspnea on exertion.No palpitations, edema, orthopnea, PND. Respiratory: No cough, hemoptysis, SOB, or wheezing. Gastrointestinal: No anorexia, dysphagia, reflux, pain, nausea, vomiting, hematemesis, diarrhea, constipation, BRBPR, or melena. Breast: No mass, nodules, bulging, or retraction. No skin changes or inflammation. No nipple discharge. No lymphadenopathy. Genitourinary: No dysuria, hematuria, incontinence, vaginal discharge, pruritis, burning, abnormal bleeding, or pain. Musculoskeletal: No decreased ROM, No joint pain or swelling. No significant pain in neck, back, or extremities. Skin: No rash, pruritis, or concerning lesions. Neurological: No headache, dizziness, syncope, seizures, tremors, memory loss, coordination problems, or  paresthesias. Psychological: No anxiety, depression, hallucinations, SI/HI. Endocrine: No polydipsia, polyphagia, polyuria, or known diabetes.No increased fatigue. No palpitations/rapid heart rate. No significant/unexplained weight change. All other systems were reviewed and are otherwise negative.  Past Medical History:  Diagnosis Date  . Depression   . Reflux      Past Surgical History:  Procedure Laterality Date  . CESAREAN SECTION      Home Meds:  Outpatient Medications Prior to Visit  Medication Sig Dispense Refill  . citalopram (CELEXA) 20 MG tablet     . Esomeprazole Magnesium (NEXIUM 24HR PO) Take by mouth.    Marland Kitchen. GIANVI 3-0.02 MG tablet     . meloxicam (MOBIC) 15 MG tablet Take 1 tablet (15 mg total) by mouth daily. 60 tablet 1  . methylPREDNISolone (MEDROL DOSEPAK) 4 MG TBPK tablet 6 day dose pack - take as directed 21 tablet 0  . Multiple Vitamin (MULTIVITAMIN) capsule Take 1 capsule by mouth daily.     Facility-Administered Medications Prior to Visit  Medication Dose Route Frequency Provider Last Rate Last Dose  . betamethasone acetate-betamethasone sodium phosphate (CELESTONE) injection 3 mg  3 mg Intramuscular Once Felecia ShellingBrent M Evans, DPM        Allergies:  Allergies  Allergen Reactions  . Morphine And Related Itching    Social History   Social History  . Marital status: Married    Spouse name: N/A  . Number of children: N/A  . Years of education: N/A   Occupational History  . Not on file.   Social History Main Topics  . Smoking status: Former Smoker    Types: Cigarettes  . Smokeless tobacco: Never Used  . Alcohol use Yes     Comment: social   . Drug use: No  . Sexual activity: Not on file  Other Topics Concern  . Not on file   Social History Narrative  . No narrative on file    Family History  Problem Relation Age of Onset  . Congestive Heart Failure Mother     NO MI, CAD, CABG per pt    Physical Exam: Blood pressure 118/78, pulse 74,  temperature 98.2 F (36.8 C), temperature source Oral, resp. rate 16, weight 283 lb 9.6 oz (128.6 kg), SpO2 98 %., Body mass index is 48.68 kg/m. General: Obese WF. Appears in no acute distress. HEENT: Normocephalic, atraumatic. Conjunctiva pink, sclera non-icteric. Pupils 2 mm constricting to 1 mm, round, regular, and equally reactive to light and accomodation. EOMI. Internal auditory canal clear. TMs with good cone of light and without pathology. Nasal mucosa pink. Nares are without discharge. No sinus tenderness. Oral mucosa pink.  Pharynx without exudate.   Neck: Supple. Trachea midline. No thyromegaly. Full ROM. No lymphadenopathy.No Carotid Bruits. Lungs: Clear to auscultation bilaterally without wheezes, rales, or rhonchi. Breathing is of normal effort and unlabored. Cardiovascular: RRR with S1 S2. No murmurs, rubs, or gallops. Distal pulses 2+ symmetrically. No carotid or abdominal bruits. Breast: Per Gyn Abdomen: Soft, non-tender, non-distended with normoactive bowel sounds. No hepatosplenomegaly or masses. No rebound/guarding. No CVA tenderness. No hernias.  Genitourinary: Per Gyn Musculoskeletal: Full range of motion and 5/5 strength throughout.  Skin: Warm and moist without erythema, ecchymosis, wounds, or rash. Neuro: A+Ox3. CN II-XII grossly intact. Moves all extremities spontaneously. Full sensation throughout. Normal gait. Psych:  Responds to questions appropriately with a normal affect.   Assessment/Plan:  41 y.o. y/o female here for CPE  1. Encounter for preventive health examination  A. Screening Labs: - CBC with Differential/Platelet - COMPLETE METABOLIC PANEL WITH GFR - Lipid panel - TSH - VITAMIN D 25 Hydroxy (Vit-D Deficiency, Fractures)  B. Pap: --Per Gyn  C. Screening Mammogram: --Per Gyn  D. DEXA/BMD:  --N/A at this age  E. Colorectal Cancer Screening: --N/A at this age. She has no family history of colon CA  F. Immunizations:  Influenza:  N/A Tetanus: She reports that she has not had a tetanus shot in over 10 years. Agreeable to update this today. T dap given here 08/27/2016 Pneumococcal: She has no indication to require a pneumonia vaccine until age 22. Zostavax: Not indicated until age 35  2. Gastroesophageal reflux disease, esophagitis presence not specified Stable/controlled on currnet tx  3. Major depressive disorder with single episode, in partial remission (HCC) Stable/controlled. Gyn Rxes Celexa  4. Obesity Being seen as a new patient today so I did not want to bring this topic up today. At next visit we'll discuss diet and exercise to help control this.  Follow-up office visit one year or sooner if needed. As well we'll follow-up with her we get lab results.  [address] [place], [place], [facility] 08/27/2016 9:54 AM

## 2016-08-28 LAB — VITAMIN D 25 HYDROXY (VIT D DEFICIENCY, FRACTURES): Vit D, 25-Hydroxy: 29 ng/mL — ABNORMAL LOW (ref 30–100)

## 2016-09-04 ENCOUNTER — Ambulatory Visit (INDEPENDENT_AMBULATORY_CARE_PROVIDER_SITE_OTHER): Payer: Managed Care, Other (non HMO) | Admitting: Podiatry

## 2016-09-04 DIAGNOSIS — M722 Plantar fascial fibromatosis: Secondary | ICD-10-CM

## 2016-09-04 DIAGNOSIS — M7662 Achilles tendinitis, left leg: Secondary | ICD-10-CM

## 2016-09-04 DIAGNOSIS — M2011 Hallux valgus (acquired), right foot: Secondary | ICD-10-CM

## 2016-09-04 DIAGNOSIS — M7751 Other enthesopathy of right foot: Secondary | ICD-10-CM

## 2016-09-04 DIAGNOSIS — M79671 Pain in right foot: Secondary | ICD-10-CM

## 2016-09-04 DIAGNOSIS — M7661 Achilles tendinitis, right leg: Secondary | ICD-10-CM | POA: Diagnosis not present

## 2016-09-04 DIAGNOSIS — M79673 Pain in unspecified foot: Secondary | ICD-10-CM | POA: Diagnosis not present

## 2016-09-04 DIAGNOSIS — M79672 Pain in left foot: Secondary | ICD-10-CM

## 2016-09-04 DIAGNOSIS — M21611 Bunion of right foot: Secondary | ICD-10-CM

## 2016-09-20 MED ORDER — BETAMETHASONE SOD PHOS & ACET 6 (3-3) MG/ML IJ SUSP: 3.0000 mg | Freq: Once | INTRAMUSCULAR | Status: DC

## 2016-09-20 NOTE — Progress Notes (Signed)
Patient ID: Tiffany Chambers, female   DOB: Apr 02, 1976, 41 y.o.   MRN: [id number]   Subjective: Patient presents today for follow-up evaluation of plantar fasciitis left foot as well as insertional Achilles tendinitis bilaterally. Patient states that the left foot plantar fasciitis is much better. She received significant relief with the Medrol Dosepak. Patient states that the bilateral Achilles tendinitis has improved but has not gone.  Objective: Physical Exam General: The patient is alert and oriented x3 in no acute distress.  Dermatology: Skin is warm, dry and supple bilateral lower extremities. Negative for open lesions or macerations bilateral.   Vascular: Dorsalis Pedis and Posterior Tibial pulses palpable bilateral.  Capillary fill time is immediate to all digits.  Neurological: Epicritic and protective threshold intact bilateral.   Musculoskeletal: Tenderness to palpation at the medial calcaneal tubercale and through the insertion of the plantar fascia of the left foot.  Symptomatic bunion deformity also noted to the right foot with a prominent metatarsal head medial eminence. Pain on palpation also noted to the insertion of the Achilles tendon bilateral lower extremities consistent with a insertional Achilles tendinitis.  Assessment: 1. Plantar fasciitis left foot 2. Hallux abductovalgus with bunion deformity right 3. Insertional Achilles tendinitis bilateral  Plan of Care:   1. Patient evaluated. Xrays reviewed.   2. Injection of 0.5cc Celestone soluspan injected into the left plantar fascia.  3. Injection of 0.5 mL Celestone Soluspan injected into the retrocalcaneal bursa right posterior calcaneus. Care was taken to avoid direct injection into the Achilles tendon. 4. Plantar fascial bands dispensed bilateral 5. Continue meloxicam 6. Return in 4 weeks  Felecia ShellingBrent M. Evans, DPM [facility]  Dr. Felecia ShellingBrent M. Evans, DPM    [address]                                         [place], [place] [address]                Office [phone]  Fax [phone]

## 2016-10-09 ENCOUNTER — Ambulatory Visit: Payer: Managed Care, Other (non HMO) | Admitting: Podiatry

## 2016-10-29 IMAGING — US US BREAST LTD UNI RIGHT INC AXILLA
1 series · 4 of 4 positions shown · non-contrast
Comparison: Baseline screening mammogram 10/18/2014

CLINICAL DATA: Further evaluation possible mass posterior right
upper outer quadrant

EXAM:
DIGITAL DIAGNOSTIC RIGHT MAMMOGRAM WITH 3D TOMOSYNTHESIS
ULTRASOUND RIGHT BREAST

[Series 1: us breast ltd uni right inc axilla · 0.07mm/px · 4 of 4 slices shown]
[im 1/4]
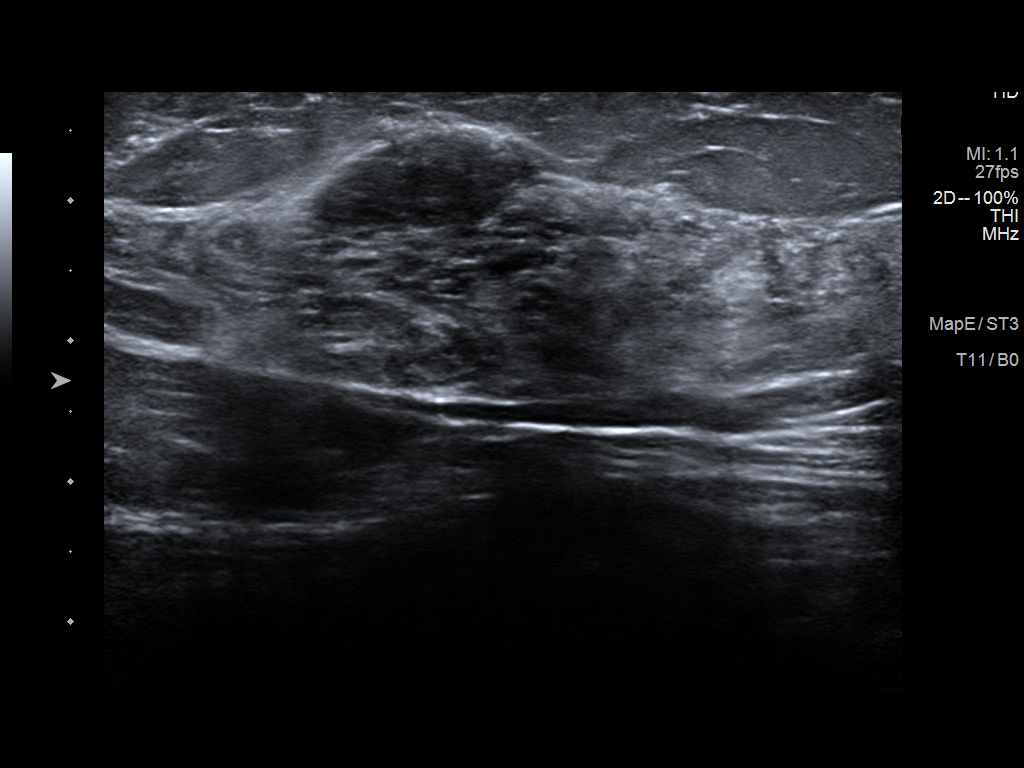
[im 2/4]
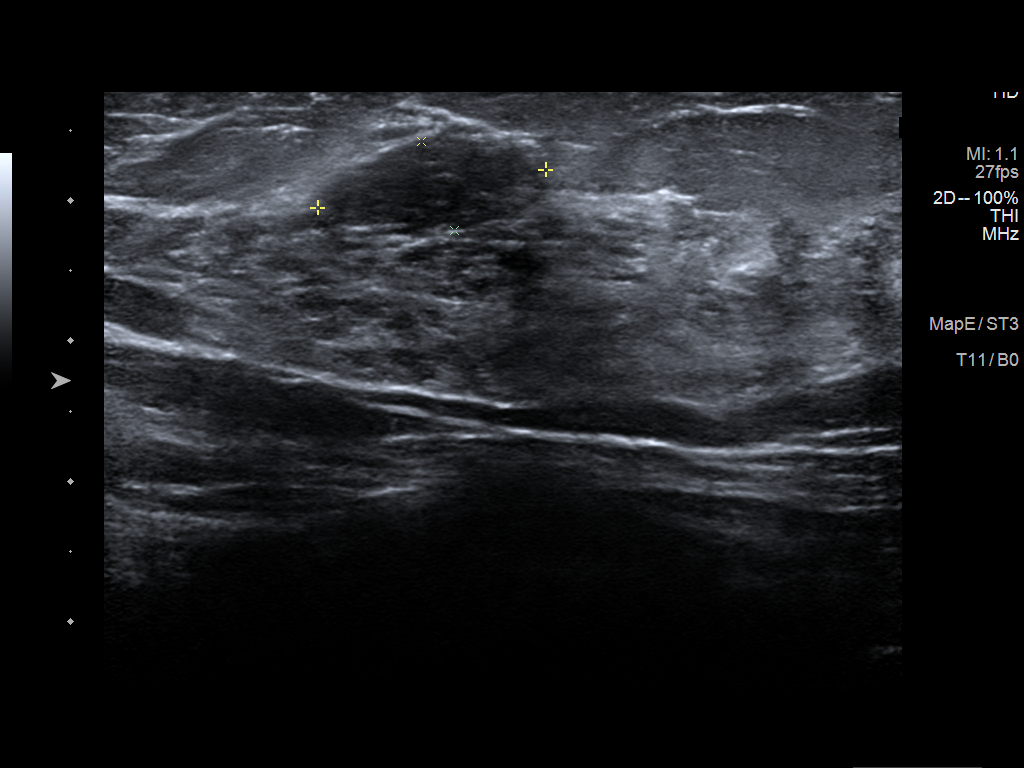
[im 3/4]
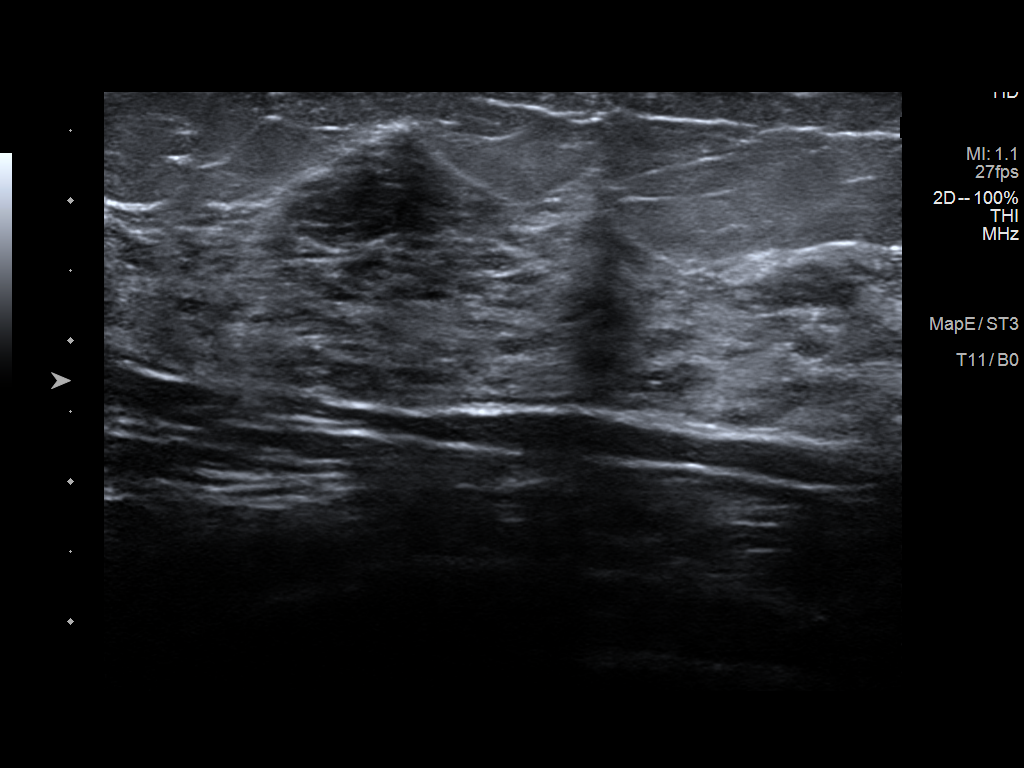
[im 4/4]
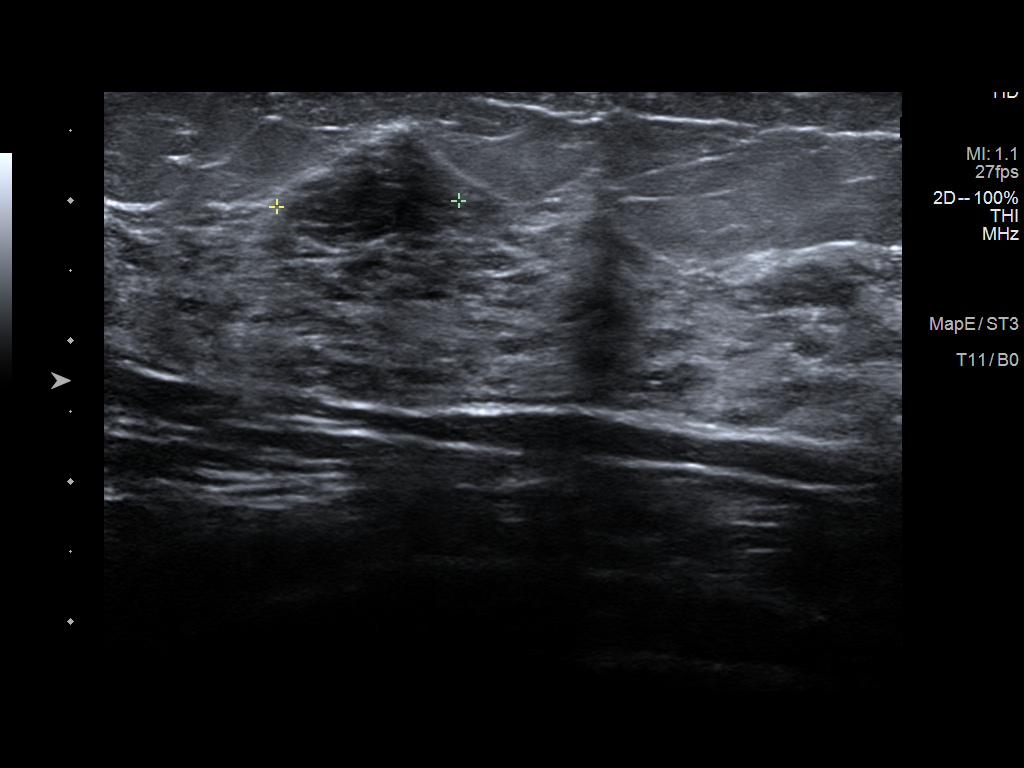

[4 of 4 positions shown; findings below may reference images not displayed]

ACR Breast Density Category d: The breast tissue is extremely dense,
which lowers the sensitivity of mammography.
FINDINGS: No persistent abnormalities on diagnostic images.

On physical exam, there are no palpable abnormalities.

Targeted ultrasound is performed, showing an oval circumscribed
hypoechoic mass with parallel orientation and equal sound
transmission located 6cm from the nipple in the 10 o'clock position
of the right breast. It measures 17 x 7 x 13mm. It appears most
consistent with a dense island of fibroglandular tissue.
Alternatively a fibroadenoma could be considered. It does not appear
to correspond to the initially queried potential mass on screening
mammogram.
IMPRESSION: Mammographically occult incidentally detected sonographic nodule
which is likely benign

RECOMMENDATION:
Ultrasound right breast in 6 months

I have discussed the findings and recommendations with the patient.
Results were also provided in writing at the conclusion of the
visit. If applicable, a reminder letter will be sent to the patient
regarding the next appointment.

BI-RADS CATEGORY  3: Probably benign.

## 2017-01-22 ENCOUNTER — Telehealth: Payer: Self-pay | Admitting: Podiatry

## 2017-01-22 MED ORDER — MELOXICAM 15 MG PO TABS: 15.0000 mg | ORAL_TABLET | Freq: Every day | ORAL | 0 refills | Status: DC

## 2017-01-22 NOTE — Telephone Encounter (Signed)
I returned call to patient, informed her that per Dr. Logan BoresEvans, we can refill this time only, but she will need to come in for follow up appt.  She stated that she will call back to schedule appt.    [facility]

## 2017-01-22 NOTE — Telephone Encounter (Signed)
I was calling to see if I could get refills on the Meloxicam. I am out.

## 2017-03-25 LAB — HM PAP SMEAR: HM Pap smear: NEGATIVE

## 2017-03-26 LAB — HM MAMMOGRAPHY

## 2017-04-07 ENCOUNTER — Encounter: Payer: Self-pay | Admitting: *Deleted

## 2017-04-08 ENCOUNTER — Encounter: Payer: Self-pay | Admitting: *Deleted

## 2018-04-28 ENCOUNTER — Ambulatory Visit: Payer: Managed Care, Other (non HMO) | Admitting: Family Medicine

## 2018-04-28 ENCOUNTER — Encounter: Payer: Self-pay | Admitting: Family Medicine

## 2018-04-28 VITALS — BP 120/78 | HR 76 | Temp 98.6°F | Resp 16 | Ht 64.0 in | Wt 282.0 lb

## 2018-04-28 DIAGNOSIS — R509 Fever, unspecified: Secondary | ICD-10-CM

## 2018-04-28 LAB — URINALYSIS, ROUTINE W REFLEX MICROSCOPIC
Bacteria, UA: NONE SEEN /HPF
Bilirubin Urine: NEGATIVE
Glucose, UA: NEGATIVE
Ketones, ur: NEGATIVE
Leukocytes, UA: NEGATIVE
Nitrite: NEGATIVE
Protein, ur: NEGATIVE
Specific Gravity, Urine: 1.015 (ref 1.001–1.03)
WBC, UA: NONE SEEN /HPF (ref 0–5)
pH: 5.5 (ref 5.0–8.0)

## 2018-04-28 LAB — MICROSCOPIC MESSAGE

## 2018-04-28 NOTE — Progress Notes (Signed)
Subjective:    Patient ID: Tiffany Chambers, female    DOB: Jun 22, 1976, 42 y.o.   MRN: [id number]  HPI Patient is a very pleasant 42 year old Caucasian female who presents with vague symptoms for 1 week.  She states that approximately 5 times a day, she will develop "hot flashes".  She will feel extremely hot all over her body.  Around the same time, she will feel extremely weak and dizzy and lightheaded.  Symptoms can last minutes to hours.  They gradually go away.  Again they occur on average 4-5 times a day.  There are no exacerbating or alleviating factors.  She is not certain if she is running a fever.  She has had some mild pain in her left submandibular gland although was not swollen today on exam.  She also reports some mild congestion but nothing startling.  She denies any otalgia.  She denies any sinus pain.  She denies any significant rhinorrhea.  She denies any sore throat.  She denies any cough or chest congestion.  She denies any hemoptysis.  She denies any nausea or vomiting or diarrhea.  She denies any dysuria.  She denies any vaginal discharge or vaginal bleeding.  She is on continuous birth control and therefore she is not having her periods.  She denies any possibility of being pregnant although she has not checked.  She denies any joint pain.  She denies any myalgias.  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 Depression   . Reflux     Current Outpatient Medications on File Prior to Visit  Medication Sig Dispense Refill  . citalopram (CELEXA) 20 MG tablet     . Esomeprazole Magnesium (NEXIUM 24HR PO) Take by mouth.    Marland Kitchen GIANVI 3-0.02 MG tablet     . Multiple Vitamin (MULTIVITAMIN) capsule Take 1 capsule by mouth daily.     Current Facility-Administered Medications on File Prior to Visit  Medication Dose Route Frequency Provider Last Rate Last Dose  . betamethasone acetate-betamethasone sodium phosphate  (CELESTONE) injection 3 mg  3 mg Intramuscular Once Gala Lewandowsky M, DPM      . betamethasone acetate-betamethasone sodium phosphate (CELESTONE) injection 3 mg  3 mg Intramuscular Once Felecia Shelling, DPM       Allergies  Allergen Reactions  . Morphine And Related Itching   Social History   Socioeconomic History  . Marital status: Married    Spouse name: Not on file  . Number of children: Not on file  . Years of education: Not on file  . Highest education level: Not on file  Occupational History  . Not on file  Social Needs  . Financial resource strain: Not on file  . Food insecurity:    Worry: Not on file    Inability: Not on file  . Transportation needs:    Medical: Not on file    Non-medical: Not on file  Tobacco Use  . Smoking status: Former Smoker    Types: Cigarettes  . Smokeless tobacco: Never Used  Substance and Sexual Activity  . Alcohol use: Yes    Comment: social   . Drug use: No  . Sexual activity: Not on file  Lifestyle  . Physical activity:    Days per week: Not on file    Minutes per session: Not on file  . Stress: Not on file  Relationships  . Social connections:    Talks  on phone: Not on file    Gets together: Not on file    Attends religious service: Not on file    Active member of club or organization: Not on file    Attends meetings of clubs or organizations: Not on file    Relationship status: Not on file  . Intimate partner violence:    Fear of current or ex partner: Not on file    Emotionally abused: Not on file    Physically abused: Not on file    Forced sexual activity: Not on file  Other Topics Concern  . Not on file  Social History Narrative  . Not on file      Review of Systems  All other systems reviewed and are negative.      Objective:   Physical Exam  Constitutional: She appears well-developed and well-nourished. No distress.  HENT:  Head: Normocephalic and atraumatic.  Right Ear: External ear normal.  Left Ear:  External ear normal.  Nose: Nose normal.  Mouth/Throat: Oropharynx is clear and moist. No oropharyngeal exudate.  Eyes: Pupils are equal, round, and reactive to light. Conjunctivae are normal. Right eye exhibits no discharge. Left eye exhibits no discharge.  Neck: Normal range of motion. Neck supple. No thyromegaly present.  Cardiovascular: Normal rate, regular rhythm and normal heart sounds. Exam reveals no gallop and no friction rub.  No murmur heard. Pulmonary/Chest: Effort normal and breath sounds normal. No stridor. No respiratory distress. She has no wheezes. She has no rales. She exhibits no tenderness.  Abdominal: Soft. Bowel sounds are normal. She exhibits no distension and no mass. There is no tenderness. There is no rebound and no guarding. No hernia.  Musculoskeletal: Normal range of motion. She exhibits no edema or tenderness.  Lymphadenopathy:    She has no cervical adenopathy.  Skin: No rash noted. She is not diaphoretic.  Vitals reviewed.         Assessment & Plan:  Fever, unspecified fever cause - Plan: CBC with Differential/Platelet, COMPLETE METABOLIC PANEL WITH GFR, TSH, Urinalysis, Routine w reflex microscopic  Patient's physical exam today is completely normal.  Differential diagnosis is broad.  Most likely the patient has been exposed to some type of virus and is having mild viral symptoms.  I will check a CBC to evaluate for leukocytosis or any bone marrow abnormalities.  I will check a TSH to evaluate for signs of thyroidism.  I will check a CMP to evaluate for any's letter light abnormalities, diabetes, or liver or kidney problems.  I will check a urinalysis to screen for an undiagnosed urinary tract infection.  If lab work is normal, I would recommend tincture of time over the weekend and pushing fluids as a suspect underlying viral syndrome.  If not better by next week, I would look into hormonal causes given the hot flashes.  Also the differential diagnosis  although extremely rare would be pheochromocytoma, carcinoid syndrome.

## 2018-04-29 LAB — COMPLETE METABOLIC PANEL WITH GFR
AG Ratio: 1.4 (calc) (ref 1.0–2.5)
ALT: 8 U/L (ref 6–29)
AST: 12 U/L (ref 10–30)
Albumin: 4 g/dL (ref 3.6–5.1)
Alkaline phosphatase (APISO): 60 U/L (ref 33–115)
BUN: 15 mg/dL (ref 7–25)
CO2: 20 mmol/L (ref 20–32)
Calcium: 9.3 mg/dL (ref 8.6–10.2)
Chloride: 103 mmol/L (ref 98–110)
Creat: 0.88 mg/dL (ref 0.50–1.10)
GFR, Est African American: 93 mL/min/{1.73_m2} (ref >=60)
GFR, Est Non African American: 80 mL/min/{1.73_m2} (ref >=60)
Globulin: 2.9 g/dL (calc) (ref 1.9–3.7)
Glucose, Bld: 82 mg/dL (ref 65–99)
Potassium: 4.8 mmol/L (ref 3.5–5.3)
Sodium: 137 mmol/L (ref 135–146)
Total Bilirubin: 0.3 mg/dL (ref 0.2–1.2)
Total Protein: 6.9 g/dL (ref 6.1–8.1)

## 2018-04-29 LAB — CBC WITH DIFFERENTIAL/PLATELET
Basophils Absolute: 38 cells/uL (ref 0–200)
Basophils Relative: 0.4 %
Eosinophils Absolute: 207 cells/uL (ref 15–500)
Eosinophils Relative: 2.2 %
HCT: 37.2 % (ref 35.0–45.0)
Hemoglobin: 12.7 g/dL (ref 11.7–15.5)
Lymphs Abs: 2782 cells/uL (ref 850–3900)
MCH: 29.9 pg (ref 27.0–33.0)
MCHC: 34.1 g/dL (ref 32.0–36.0)
MCV: 87.5 fL (ref 80.0–100.0)
MPV: 9.4 fL (ref 7.5–12.5)
Monocytes Relative: 4.9 %
Neutro Abs: 5913 cells/uL (ref 1500–7800)
Neutrophils Relative %: 62.9 %
Platelets: 405 10*3/uL — ABNORMAL HIGH (ref 140–400)
RBC: 4.25 10*6/uL (ref 3.80–5.10)
RDW: 12.7 % (ref 11.0–15.0)
Total Lymphocyte: 29.6 %
WBC mixed population: 461 cells/uL (ref 200–950)
WBC: 9.4 10*3/uL (ref 3.8–10.8)

## 2018-04-29 LAB — TSH: TSH: 1.71 mIU/L

## 2019-05-16 ENCOUNTER — Other Ambulatory Visit: Payer: Self-pay | Admitting: Obstetrics and Gynecology

## 2019-05-16 DIAGNOSIS — N6320 Unspecified lump in the left breast, unspecified quadrant: Secondary | ICD-10-CM

## 2019-05-16 DIAGNOSIS — N6310 Unspecified lump in the right breast, unspecified quadrant: Secondary | ICD-10-CM

## 2019-05-31 ENCOUNTER — Ambulatory Visit
Admission: RE | Admit: 2019-05-31 | Discharge: 2019-05-31 | Disposition: A | Discharge status: Home / Self Care | Payer: BLUE CROSS/BLUE SHIELD | Source: Ambulatory Visit | Attending: Obstetrics and Gynecology | Admitting: Obstetrics and Gynecology

## 2019-05-31 ENCOUNTER — Other Ambulatory Visit: Payer: Self-pay

## 2019-05-31 ENCOUNTER — Ambulatory Visit
Admission: RE | Admit: 2019-05-31 | Discharge: 2019-05-31 | Disposition: A | Discharge status: Home / Self Care | Payer: Managed Care, Other (non HMO) | Source: Ambulatory Visit | Attending: Obstetrics and Gynecology | Admitting: Obstetrics and Gynecology

## 2019-05-31 DIAGNOSIS — N6320 Unspecified lump in the left breast, unspecified quadrant: Secondary | ICD-10-CM

## 2019-05-31 DIAGNOSIS — N6310 Unspecified lump in the right breast, unspecified quadrant: Secondary | ICD-10-CM

## 2019-08-08 ENCOUNTER — Other Ambulatory Visit: Payer: Self-pay

## 2019-08-08 ENCOUNTER — Ambulatory Visit: Payer: Managed Care, Other (non HMO) | Attending: Internal Medicine

## 2019-08-08 DIAGNOSIS — Z20822 Contact with and (suspected) exposure to covid-19: Secondary | ICD-10-CM

## 2019-08-09 LAB — NOVEL CORONAVIRUS, NAA: SARS-CoV-2, NAA: NOT DETECTED

## 2019-08-11 ENCOUNTER — Ambulatory Visit: Payer: Managed Care, Other (non HMO) | Attending: Internal Medicine

## 2019-08-11 ENCOUNTER — Other Ambulatory Visit: Payer: Self-pay

## 2019-08-11 DIAGNOSIS — Z20822 Contact with and (suspected) exposure to covid-19: Secondary | ICD-10-CM

## 2019-08-13 LAB — NOVEL CORONAVIRUS, NAA: SARS-CoV-2, NAA: DETECTED — AB

## 2019-08-14 ENCOUNTER — Telehealth: Payer: Self-pay | Admitting: Unknown Physician Specialty

## 2019-08-14 ENCOUNTER — Telehealth: Payer: Self-pay | Admitting: Nurse Practitioner

## 2019-08-14 NOTE — Telephone Encounter (Signed)
Called to discuss with patient about Covid symptoms and the use of bamlanivimab, a monoclonal antibody infusion for those with mild to moderate Covid symptoms and at a high risk of hospitalization.  Pt is qualified for this infusion at the [facility] due to BMI>35   Message left to call back  

## 2019-08-14 NOTE — Telephone Encounter (Signed)
Called to Discuss with patient about Covid symptoms and the use of bamlanivimab, a monoclonal antibody infusion for those with mild to moderate Covid symptoms and at a high risk of hospitalization.     Pt is qualified for this infusion at the [facility] due to co-morbid conditions and/or a member of an at-risk group.     Unable to reach pt  

## 2019-08-17 ENCOUNTER — Other Ambulatory Visit: Payer: Self-pay

## 2019-08-17 ENCOUNTER — Telehealth: Payer: Self-pay | Admitting: Physician Assistant

## 2019-08-17 ENCOUNTER — Ambulatory Visit (INDEPENDENT_AMBULATORY_CARE_PROVIDER_SITE_OTHER): Payer: Managed Care, Other (non HMO) | Admitting: Nurse Practitioner

## 2019-08-17 ENCOUNTER — Encounter (INDEPENDENT_AMBULATORY_CARE_PROVIDER_SITE_OTHER): Payer: Self-pay

## 2019-08-17 DIAGNOSIS — U071 COVID-19: Secondary | ICD-10-CM | POA: Diagnosis not present

## 2019-08-17 NOTE — Telephone Encounter (Signed)
Call placed to patient.   Appointment scheduled for telephone visit with provider.

## 2019-08-17 NOTE — Progress Notes (Signed)
Virtual Visit via Telephone Note    I connected withElizabeth Chambers on 08/17/2019 at 03:38PM by telephoneand verified that I am speaking with the correct person using two identifiers.  Pt location: at home   Physician location:  In office, [facility], Lawson Fiscal, FNP-C    On call: patient and physician   I discussed the limitations, risks, security and privacy concerns of performing an evaluation and management service by telephone and the availability of in person appointments. I also discussed with the patient that there may be a patient responsible charge related to this service. The patient expressed understanding and agreed to proceed.   History of Present Illness:   Pt is a 44 y.o. caucasian female presenting for telephone visit. She reported that last thursday night she started having sxs of COVID and found out she was positive for COVID. She reports that she desired an apt today for headache that is persistent. She is using otc medications for her sxs. She has used Dayquil, Nyquil but not at dosage to control headache. Not used decongestant, humidifier, Flonase or any other sxs relief tx. Denies any fever/ chills/ CP/CT, GU/GI sxs, other pain, SOB.  Observations/Objective: NAD noted, able to speak in full sentences but very congested sounding, harsh cough, no wheeze heard   Assessment and Plan: You have COVID virus and should continue to treat your symptoms for relief as it is a virus and self limiting.  Get plenty of rest and Drink plenty of fluids Use OTC Flonase for rhinitis Use OTC decongestants as directed Do not use combination therapy only take specific medication for each symptom Use humidifier.  Go to ER or call 911 for shortness of breath or difficulty breathing 1. Stay home from work and school. And stay away from other public places. If you must go out, avoid using any kind of public transportation, ridesharing, or taxis. 2. Monitor your  symptoms carefully. If your symptoms get worse, call your healthcare provider immediately. 3. Get rest and stay hydrated. 4. If you have a medical appointment, call the healthcare provider ahead of time and tell them that you have or may have COVID-19. 5. For medical emergencies, call 911 and notify the dispatch personnel that you have or may have COVID-19.  Follow Up Instructions:  I discussed the assessment and treatment plan with the patient. The patient was provided an opportunity to ask questions and all were answered. The patient agreed with the plan and demonstrated an understanding of the instructions.  The patient was advised to call back or seek an in-person evaluation if the symptoms worsen or if the condition fails to improve as anticipated.  I provided 10 minutes of non-face-to-face time during this encounter. End Time 03:48PM  Lawson Fiscal, FNP-C

## 2019-08-17 NOTE — Telephone Encounter (Signed)
Patient left message saying she was positive for covid  She says she is having some pretty severe sinus symptoms along with that, would like to know what she could take or something can be called in for her  [phone]

## 2019-08-17 NOTE — Patient Instructions (Signed)
F/u for non resolving or worsening symptoms

## 2019-08-18 ENCOUNTER — Telehealth: Payer: Self-pay | Admitting: Physician Assistant

## 2019-08-18 ENCOUNTER — Encounter (INDEPENDENT_AMBULATORY_CARE_PROVIDER_SITE_OTHER): Payer: Self-pay

## 2019-08-18 NOTE — Telephone Encounter (Signed)
Patient calling saying she had telephone visit with crystal and forgot to get work note and would like to get the note extended through Monday if possible? Would like a call back regarding all of this  [phone]

## 2019-08-19 ENCOUNTER — Encounter (INDEPENDENT_AMBULATORY_CARE_PROVIDER_SITE_OTHER): Payer: Self-pay

## 2019-08-21 ENCOUNTER — Telehealth: Payer: Self-pay

## 2019-08-21 ENCOUNTER — Encounter (INDEPENDENT_AMBULATORY_CARE_PROVIDER_SITE_OTHER): Payer: Self-pay

## 2019-08-21 NOTE — Telephone Encounter (Signed)
New onset vomiting 2 days ago. After she eats gets stomach cramping and the pain make her throw up. Pt describes cramping and then has diarrhea. Pain from the cramping makes her vomit. No nausea. Sever pain that comes and goes, goes away after she has diarrhea and vomits.  Pt is tolerating fluids. 5- 12 ox a day.  Last vomited  Last night . Ate crackers today and no issues after.  . If appetite becomes worse:   encourage patient to drink fluids as tolerated, work their way up to bland solid food such as crackers, pretzels, soup, bread or applesauce and boiled starches.  . If patient is unable to tolerate any foods or liquids, notify PCP. Marland Kitchen  If patient develops severe vomiting (more than 6 times a day and or >8 hours) and/or severe abdominal pain advise patient to call 911 and seek treatment in ED . If diarrhea remains the same:  encourage patient to drink oral fluids and bland foods.  . Avoid alcohol, spicy foods, caffeine or fatty foods that could make diarrhea worse.  . Continue to monitor for signs of dehydration (increased thirst decreased urine output, yellow urine, dry skin, headache or dizziness).  Advise patient to try OTC medication (Imodium, kaopectate, Pepto-Bismol) as per manufacturer's instructions . If worsening diarrhea occurs and becomes severe (6-7 bowel movements a day): notify PCP  . If diarrhea last greater than 7 days: notify PCP . If signs of dehydration occur (increased thirst, decreased urine output, yellow urine, dry skin, headache or dizziness) advise patient to call 911 and seek treatment in the ED  Pt verbalized understanding.

## 2019-08-22 ENCOUNTER — Telehealth: Payer: Self-pay | Admitting: Physician Assistant

## 2019-08-22 NOTE — Telephone Encounter (Signed)
Hey [name]   Ms Chambers needs a note saying she can go back to work on Thursday if possible  :)

## 2019-08-22 NOTE — Telephone Encounter (Signed)
Patient calling to say she needs to speak to crystal regarding documentation for work  [phone]

## 2019-08-22 NOTE — Telephone Encounter (Signed)
I can give her a work note if that is what she needs. Is she still having sxs?

## 2019-08-23 ENCOUNTER — Ambulatory Visit: Payer: Managed Care, Other (non HMO)

## 2019-10-10 ENCOUNTER — Other Ambulatory Visit: Payer: Self-pay

## 2019-10-10 ENCOUNTER — Other Ambulatory Visit: Payer: Self-pay | Admitting: Podiatry

## 2019-10-10 ENCOUNTER — Ambulatory Visit (INDEPENDENT_AMBULATORY_CARE_PROVIDER_SITE_OTHER): Payer: Managed Care, Other (non HMO)

## 2019-10-10 ENCOUNTER — Ambulatory Visit: Payer: Managed Care, Other (non HMO) | Admitting: Podiatry

## 2019-10-10 DIAGNOSIS — M216X9 Other acquired deformities of unspecified foot: Secondary | ICD-10-CM | POA: Diagnosis not present

## 2019-10-10 DIAGNOSIS — N920 Excessive and frequent menstruation with regular cycle: Secondary | ICD-10-CM | POA: Insufficient documentation

## 2019-10-10 DIAGNOSIS — M7661 Achilles tendinitis, right leg: Secondary | ICD-10-CM | POA: Diagnosis not present

## 2019-10-10 DIAGNOSIS — M7662 Achilles tendinitis, left leg: Secondary | ICD-10-CM

## 2019-10-10 DIAGNOSIS — M7751 Other enthesopathy of right foot: Secondary | ICD-10-CM | POA: Diagnosis not present

## 2019-10-10 DIAGNOSIS — M7750 Other enthesopathy of unspecified foot: Secondary | ICD-10-CM

## 2019-10-10 DIAGNOSIS — M722 Plantar fascial fibromatosis: Secondary | ICD-10-CM

## 2019-10-10 DIAGNOSIS — L659 Nonscarring hair loss, unspecified: Secondary | ICD-10-CM | POA: Insufficient documentation

## 2019-10-10 DIAGNOSIS — M79671 Pain in right foot: Secondary | ICD-10-CM

## 2019-10-10 DIAGNOSIS — M79672 Pain in left foot: Secondary | ICD-10-CM

## 2019-10-10 MED ORDER — MELOXICAM 15 MG PO TABS: 15.0000 mg | ORAL_TABLET | Freq: Every day | ORAL | 0 refills | Status: DC

## 2019-10-10 MED ORDER — METHYLPREDNISOLONE 4 MG PO TBPK: ORAL_TABLET | ORAL | 0 refills | Status: DC

## 2019-10-10 NOTE — Patient Instructions (Signed)

## 2019-10-10 NOTE — Progress Notes (Signed)
  Subjective:  Patient ID: Tiffany Chambers, female    DOB: [birth year],  MRN: [id number]  Chief Complaint  Patient presents with  . Plantar Fasciitis    Pt states bilateral plantar fasciitis pain is worsening, particularly the left.  . Tendonitis    Pt states bilateral achilles tendonitis pain has not gone away  . Ankle Pain    Right foot lateral ankle pain 1 year duration, on and off, "I twisted my ankle"    44 y.o. female presents with the above complaint. History confirmed with patient.   Objective:  Physical Exam: warm, good capillary refill, no trophic changes or ulcerative lesions, normal DP and PT pulses and normal sensory exam. Left Foot: point tenderness over the heel pad and tenderness at Achilles tendon insertion  Right Foot: point tenderness over the heel pad, tenderness at Achilles tendon insertion and tenderness at the ATFL Right. Negative anterior drawer   Radiographs: X-ray of both feet: no fracture, dislocation, swelling or degenerative changes noted, plantar calcaneal spur, posterior calcaneal spur and Haglund deformity noted Assessment:   1. Plantar fasciitis   2. Achilles tendonitis, bilateral   3. Equinus deformity of foot      Plan:  Patient was evaluated and treated and all questions answered.  Achilles Tendonitis, Plantar Fasciitis and Equinus -XR reviewed with patient -Educated patient on stretching and icing of the affected limb -Night splint dispensed -Plantar fascial brace dispensed -Rx for meloxicam. Educated on use, risks and benefits of the medication -Rx for medrol pack. Educated on use, risks, and benefits of the medication  Return in about 4 weeks (around 11/07/2019).

## 2019-10-24 ENCOUNTER — Telehealth: Payer: Managed Care, Other (non HMO) | Admitting: Nurse Practitioner

## 2019-10-24 ENCOUNTER — Other Ambulatory Visit: Payer: Self-pay

## 2019-10-24 VITALS — BP 116/80 | HR 82 | Temp 97.6°F | Resp 18 | Wt 290.4 lb

## 2019-10-24 DIAGNOSIS — L239 Allergic contact dermatitis, unspecified cause: Secondary | ICD-10-CM | POA: Diagnosis not present

## 2019-10-24 DIAGNOSIS — R21 Rash and other nonspecific skin eruption: Secondary | ICD-10-CM

## 2019-10-24 MED ORDER — PREDNISONE 10 MG PO TABS: ORAL_TABLET | ORAL | 0 refills | Status: DC

## 2019-10-24 NOTE — Progress Notes (Signed)
Virtual Visit via Video Note  I connected with Tiffany Chambers on 10/24/19 at 11:15 AM EDT by a video enabled telemedicine application and verified that I am speaking with the correct person using two identifiers.   I discussed the limitations of evaluation and management by telemedicine and the availability of in person appointments. The patient expressed understanding and agreed to proceed.  History of Present Illness: Rash started 3-4 days agothat is spreading worse Inner thigh to arm pits up towards neck chest and lower legs Exposures mixing new lotions but all have used before, no new laundry detergent, no outside plant exposures.  No nasal drainage, fever/ chills, lost of taste or smell, coughing, or headache.   Tried treatments none  Observations/Objective: Non toxic, ill looking, no cough, speaks full sentences.   Assessment and Plan: Come in at 1115 to clinic for evaluation.   Follow Up Instructions:    I discussed the assessment and treatment plan with the patient. The patient was provided an opportunity to ask questions and all were answered. The patient agreed with the plan and demonstrated an understanding of the instructions.   The patient was advised to call back or seek an in-person evaluation if the symptoms worsen or if the condition fails to improve as anticipated.  I provided 5 minutes of non-face-to-face time during this encounter. 1005  Elmore Guise, FNP     Acute Office Visit  Subjective:    Patient ID: Tiffany Chambers, female    DOB: 1975/08/26, 44 y.o.   MRN: [id number]  Chief Complaint  Patient presents with  . Rash    between thighs, arms, neck, back , legs, itching, burning, started x4 days, hydrocortisone cream and benedryl    HPI Patient is a 44 year old female presenting for clinic evaluation of rash after video apt this AM.   Past Medical History:  Diagnosis Date  . Depression   . Reflux     Past Surgical History:    Procedure Laterality Date  . CESAREAN SECTION      Family History  Problem Relation Age of Onset  . Congestive Heart Failure Mother        NO MI, CAD, CABG per pt    Social History   Socioeconomic History  . Marital status: Married    Spouse name: Not on file  . Number of children: Not on file  . Years of education: Not on file  . Highest education level: Not on file  Occupational History  . Not on file  Tobacco Use  . Smoking status: Former Smoker    Types: Cigarettes  . Smokeless tobacco: Never Used  Substance and Sexual Activity  . Alcohol use: Yes    Comment: social   . Drug use: No  . Sexual activity: Not on file  Other Topics Concern  . Not on file  Social History Narrative  . Not on file   Social Determinants of Health   Financial Resource Strain:   . Difficulty of Paying Living Expenses:   Food Insecurity:   . Worried About Programme researcher, broadcasting/film/video in the Last Year:   . Barista in the Last Year:   Transportation Needs:   . Freight forwarder (Medical):   Marland Kitchen Lack of Transportation (Non-Medical):   Physical Activity:   . Days of Exercise per Week:   . Minutes of Exercise per Session:   Stress:   . Feeling of Stress :   Social Connections:   .  Frequency of Communication with Friends and Family:   . Frequency of Social Gatherings with Friends and Family:   . Attends Religious Services:   . Active Member of Clubs or Organizations:   . Attends Banker Meetings:   Marland Kitchen Marital Status:   Intimate Partner Violence:   . Fear of Current or Ex-Partner:   . Emotionally Abused:   Marland Kitchen Physically Abused:   . Sexually Abused:     Outpatient Medications Prior to Visit  Medication Sig Dispense Refill  . citalopram (CELEXA) 20 MG tablet     . Esomeprazole Magnesium (NEXIUM 24HR PO) Take by mouth.    Marland Kitchen GIANVI 3-0.02 MG tablet     . meloxicam (MOBIC) 15 MG tablet Take 1 tablet (15 mg total) by mouth daily. Start the day after finishing the steroid  pack 30 tablet 0  . Multiple Vitamin (MULTIVITAMIN) capsule Take 1 capsule by mouth daily.    . methylPREDNISolone (MEDROL DOSEPAK) 4 MG TBPK tablet 6 Day Taper Pack. Take as Directed. 21 tablet 0   Facility-Administered Medications Prior to Visit  Medication Dose Route Frequency Provider Last Rate Last Admin  . betamethasone acetate-betamethasone sodium phosphate (CELESTONE) injection 3 mg  3 mg Intramuscular Once Gala Lewandowsky M, DPM      . betamethasone acetate-betamethasone sodium phosphate (CELESTONE) injection 3 mg  3 mg Intramuscular Once Felecia Shelling, DPM        Allergies  Allergen Reactions  . Morphine And Related Itching    Review of Systems  All other systems reviewed and are negative.      Objective:    Physical Exam Vitals and nursing note reviewed. Exam conducted with a chaperone present.  Constitutional:      Appearance: Normal appearance.  HENT:     Head: Normocephalic.     Right Ear: External ear normal.     Left Ear: External ear normal.  Eyes:     Extraocular Movements: Extraocular movements intact.     Conjunctiva/sclera: Conjunctivae normal.     Pupils: Pupils are equal, round, and reactive to light.  Cardiovascular:     Rate and Rhythm: Normal rate.  Pulmonary:     Effort: Pulmonary effort is normal.  Musculoskeletal:        General: Normal range of motion.     Cervical back: Normal range of motion and neck supple.  Skin:    General: Skin is warm and dry.     Capillary Refill: Capillary refill takes less than 2 seconds.     Findings: Erythema and rash present. No laceration, petechiae or wound. Rash is urticarial.          Comments: Rash areas outline.   Neurological:     General: No focal deficit present.     Mental Status: She is alert and oriented to person, place, and time.  Psychiatric:        Mood and Affect: Mood normal.        Thought Content: Thought content normal.     BP 116/80 (BP Location: Left Arm, Patient Position:  Sitting, Cuff Size: Normal)   Pulse 82   Temp 97.6 F (36.4 C) (Temporal)   Resp 18   Wt 290 lb 6.4 oz (131.7 kg)   SpO2 98%   BMI 49.85 kg/m  Wt Readings from Last 3 Encounters:  10/24/19 290 lb 6.4 oz (131.7 kg)  04/28/18 282 lb (127.9 kg)  08/27/16 283 lb 9.6 oz (128.6 kg)    Health  Maintenance Due  Topic Date Due  . COVID-19 Vaccine (1) Never done    There are no preventive care reminders to display for this patient.   Lab Results  Component Value Date   TSH 1.71 04/28/2018   Lab Results  Component Value Date   WBC 9.4 04/28/2018   HGB 12.7 04/28/2018   HCT 37.2 04/28/2018   MCV 87.5 04/28/2018   PLT 405 (H) 04/28/2018   Lab Results  Component Value Date   NA 137 04/28/2018   K 4.8 04/28/2018   CO2 20 04/28/2018   GLUCOSE 82 04/28/2018   BUN 15 04/28/2018   CREATININE 0.88 04/28/2018   BILITOT 0.3 04/28/2018   ALKPHOS 53 08/27/2016   AST 12 04/28/2018   ALT 8 04/28/2018   PROT 6.9 04/28/2018   ALBUMIN 3.6 08/27/2016   CALCIUM 9.3 04/28/2018   Lab Results  Component Value Date   CHOL 226 (H) 08/27/2016   Lab Results  Component Value Date   HDL 83 08/27/2016   Lab Results  Component Value Date   LDLCALC 100 (H) 08/27/2016   Lab Results  Component Value Date   TRIG 215 (H) 08/27/2016   Lab Results  Component Value Date   CHOLHDL 2.7 08/27/2016   No results found for: HGBA1C     Assessment & Plan:   Problem List Items Addressed This Visit    None    Visit Diagnoses    Rash and nonspecific skin eruption    -  Primary   Allergic dermatitis         May take Benadryl at night May take Claritin 10mg  in AM May take pepcid 20mg  OTC twice a day Take prescrition of Prednisone as directed until completed Follow up for worsening or non resolving symptoms  Follow Up for worsening or non resolving sxs.   Annie Main, FNP

## 2019-10-24 NOTE — Patient Instructions (Signed)
May take Benadryl at night May take Claritin 10mg  in AM May take pepcid 20mg  OTC twice a day Take prescrition of Prednisone as directed until completed Follow up for worsening or non resolving symptoms

## 2019-11-09 ENCOUNTER — Ambulatory Visit: Payer: Managed Care, Other (non HMO) | Admitting: Podiatry

## 2019-11-30 ENCOUNTER — Other Ambulatory Visit: Payer: Self-pay

## 2019-11-30 ENCOUNTER — Ambulatory Visit: Payer: Managed Care, Other (non HMO) | Admitting: Podiatry

## 2019-11-30 DIAGNOSIS — M722 Plantar fascial fibromatosis: Secondary | ICD-10-CM

## 2019-11-30 MED ORDER — MELOXICAM 15 MG PO TABS: 15.0000 mg | ORAL_TABLET | Freq: Every day | ORAL | 0 refills | Status: DC

## 2019-12-21 ENCOUNTER — Ambulatory Visit (INDEPENDENT_AMBULATORY_CARE_PROVIDER_SITE_OTHER): Payer: Managed Care, Other (non HMO) | Admitting: Podiatry

## 2019-12-21 ENCOUNTER — Other Ambulatory Visit: Payer: Self-pay

## 2019-12-21 VITALS — Temp 96.7°F

## 2019-12-21 DIAGNOSIS — M722 Plantar fascial fibromatosis: Secondary | ICD-10-CM | POA: Diagnosis not present

## 2019-12-21 NOTE — Progress Notes (Signed)
  Subjective:  Patient ID: Tiffany Chambers, female    DOB: 1975-09-10,  MRN: [id number]  Chief Complaint  Patient presents with  . Follow-up    Bilateral plantar fasciitis. Pt stated, "Much better - no pain in the L foot. 2/10 in the right foot. I have not needed the brace or the night splint. Taking meloxicam. Finished Medrol".    44 y.o. female presents with the above complaint. History confirmed with patient.   Objective:  Physical Exam: warm, good capillary refill, no trophic changes or ulcerative lesions, normal DP and PT pulses and normal sensory exam. Right Foot: point tenderness over the heel pad, tenderness at Achilles tendon insertion and tenderness at the ATFL Right. Negative anterior drawer    Assessment:   1. Plantar fasciitis    Plan:  Patient was evaluated and treated and all questions answered.  Achilles Tendonitis, Plantar Fasciitis and Equinus -Right heel plantar fasciitis repeat injection. -F/u PRN  Procedure: Injection Tendon/Ligament Consent: Verbal consent obtained. Location: Right plantar fascia at the glabrous junction; medial approach. Skin Prep: Alcohol. Injectate: 1 cc 0.5% marcaine plain, 1 cc dexamethasone phosphate, 0.5 cc kenalog 10. Disposition: Patient tolerated procedure well. Injection site dressed with a band-aid.   Return if symptoms worsen or fail to improve.

## 2019-12-24 NOTE — Progress Notes (Signed)
  Subjective:  Patient ID: Tiffany Chambers, female    DOB: 24-Dec-1975,  MRN: [id number]  Chief Complaint  Patient presents with  . Foot Pain    bilateral, 6 week follow up   44 y.o. female presents with the above complaint. History confirmed with patient.  States that the stretches do help relief does not last.  States that the still really bad after sitting for a while.  Steroids helped but the meloxicam did not help as much.  Objective:  Physical Exam: warm, good capillary refill, no trophic changes or ulcerative lesions, normal DP and PT pulses and normal sensory exam. Left Foot: point tenderness over the heel pad and tenderness at Achilles tendon insertion  Right Foot: point tenderness over the heel pad, tenderness at Achilles tendon insertion and tenderness at the ATFL Right. Negative anterior drawer   Assessment:   1. Plantar fasciitis      Plan:  Patient was evaluated and treated and all questions answered.  Achilles Tendonitis, Plantar Fasciitis and Equinus -Injection delivered to the plantar fascia of both feet. -Rx meloxicam  Procedure: Injection Tendon/Ligament Consent: Verbal consent obtained. Location: Bilateral plantar fascia at the glabrous junction; medial approach. Skin Prep: Alcohol. Injectate: 1 cc 0.5% marcaine plain, 1 cc dexamethasone phosphate, 0.5 cc kenalog 10. Disposition: Patient tolerated procedure well. Injection site dressed with a band-aid.    Return in about 3 weeks (around 12/21/2019) for Plantar fasciitis.

## 2020-03-01 ENCOUNTER — Other Ambulatory Visit: Payer: Self-pay

## 2020-03-01 ENCOUNTER — Ambulatory Visit: Payer: Managed Care, Other (non HMO) | Admitting: Podiatry

## 2020-03-01 DIAGNOSIS — M722 Plantar fascial fibromatosis: Secondary | ICD-10-CM

## 2020-03-01 DIAGNOSIS — M7662 Achilles tendinitis, left leg: Secondary | ICD-10-CM

## 2020-03-01 DIAGNOSIS — M7661 Achilles tendinitis, right leg: Secondary | ICD-10-CM | POA: Diagnosis not present

## 2020-03-01 DIAGNOSIS — M216X9 Other acquired deformities of unspecified foot: Secondary | ICD-10-CM | POA: Diagnosis not present

## 2020-03-01 MED ORDER — MELOXICAM 15 MG PO TABS: 15.0000 mg | ORAL_TABLET | Freq: Every day | ORAL | 0 refills | Status: DC

## 2020-03-01 MED ORDER — METHYLPREDNISOLONE 4 MG PO TBPK: ORAL_TABLET | ORAL | 0 refills | Status: DC

## 2020-03-03 NOTE — Progress Notes (Signed)
  Subjective:  Patient ID: Lyndle Herrlich, female    DOB: 1976-06-30,  MRN: [id number]  Chief Complaint  Patient presents with  . Tendonitis    Worse than previous visit.  . Plantar Fasciitis    Worse than previous visit.    44 y.o. female presents with the above complaint. History confirmed with patient.   Objective:  Physical Exam: warm, good capillary refill, no trophic changes or ulcerative lesions, normal DP and PT pulses and normal sensory exam. Right Foot: point tenderness over the heel pad, tenderness at Achilles tendon insertion and tenderness at the ATFL Right. Negative anterior drawer    Assessment:   1. Plantar fasciitis   2. Achilles tendonitis, bilateral   3. Equinus deformity of foot    Plan:  Patient was evaluated and treated and all questions answered.  Achilles Tendonitis, Plantar Fasciitis and Equinus  -Rx for Medrol and Meloxicam. Advised to start Meloxicam the day after completion of oral steroid taper.  -Continue stretching.    Return in about 4 weeks (around 03/29/2020) for Plantar fasciitis, Tendonitis.

## 2020-03-26 ENCOUNTER — Ambulatory Visit (INDEPENDENT_AMBULATORY_CARE_PROVIDER_SITE_OTHER): Payer: Managed Care, Other (non HMO) | Admitting: Podiatry

## 2020-03-26 ENCOUNTER — Other Ambulatory Visit: Payer: Self-pay

## 2020-03-26 DIAGNOSIS — M722 Plantar fascial fibromatosis: Secondary | ICD-10-CM | POA: Diagnosis not present

## 2020-04-04 NOTE — Progress Notes (Signed)
  Subjective:  Patient ID: Tiffany Chambers, female    DOB: [birth year],  MRN: [id number]  Chief Complaint  Patient presents with  . Plantar Fasciitis    Pt states improvement with medications, right still experiencing plantar heel pain, left is improved.  . Foot Problem    Right plantar midfoot "vibrating sensation" at night.    44 y.o. female presents with the above complaint. History confirmed with patient.   Objective:  Physical Exam: warm, good capillary refill, no trophic changes or ulcerative lesions, normal DP and PT pulses and normal sensory exam. Right Foot: point tenderness over the heel pad, tenderness at Achilles tendon insertion and tenderness at the ATFL Right. Negative anterior drawer    Assessment:   1. Plantar fasciitis    Plan:  Patient was evaluated and treated and all questions answered.  Achilles Tendonitis, Plantar Fasciitis and Equinus  -Still having pain to the right foot -Injection delivered right   Procedure: Injection Tendon/Ligament Consent: Verbal consent obtained. Location: Right plantar fascia at the glabrous junction; medial approach. Skin Prep: Alcohol. Injectate: 1 cc 0.5% marcaine plain, 1 cc dexamethasone phosphate, 0.5 cc kenalog 10. Disposition: Patient tolerated procedure well. Injection site dressed with a band-aid.     No follow-ups on file.

## 2020-05-21 ENCOUNTER — Telehealth: Payer: Self-pay | Admitting: Podiatry

## 2020-05-21 NOTE — Telephone Encounter (Signed)
Patient called in stating you informed her to call to make appointment to be seen if she starts to experience pain again. Patient requested these prescriptions (meloxicam) and (methylPREDNISolone) to hold her until she is able to come in to be seen, please advise

## 2020-05-22 NOTE — Telephone Encounter (Signed)
Pt called back requesting medication for her foot

## 2020-05-24 ENCOUNTER — Other Ambulatory Visit: Payer: Self-pay | Admitting: Podiatry

## 2020-05-24 MED ORDER — MELOXICAM 15 MG PO TABS: 15.0000 mg | ORAL_TABLET | Freq: Every day | ORAL | 0 refills | Status: DC

## 2020-05-24 MED ORDER — METHYLPREDNISOLONE 4 MG PO TBPK: ORAL_TABLET | ORAL | 0 refills | Status: DC

## 2020-06-06 NOTE — Telephone Encounter (Signed)
Previously addressed.

## 2020-07-12 ENCOUNTER — Ambulatory Visit: Payer: Managed Care, Other (non HMO) | Admitting: Podiatry

## 2020-07-12 ENCOUNTER — Other Ambulatory Visit: Payer: Self-pay

## 2020-07-12 DIAGNOSIS — M216X9 Other acquired deformities of unspecified foot: Secondary | ICD-10-CM | POA: Diagnosis not present

## 2020-07-12 DIAGNOSIS — M722 Plantar fascial fibromatosis: Secondary | ICD-10-CM

## 2020-07-12 DIAGNOSIS — M7661 Achilles tendinitis, right leg: Secondary | ICD-10-CM | POA: Diagnosis not present

## 2020-07-12 DIAGNOSIS — M7662 Achilles tendinitis, left leg: Secondary | ICD-10-CM

## 2020-07-12 MED ORDER — KETOROLAC TROMETHAMINE 10 MG PO TABS: 10.0000 mg | ORAL_TABLET | Freq: Four times a day (QID) | ORAL | 0 refills | Status: DC | PRN

## 2020-07-12 MED ORDER — ONDANSETRON HCL 4 MG PO TABS: 4.0000 mg | ORAL_TABLET | Freq: Three times a day (TID) | ORAL | 0 refills | Status: DC | PRN

## 2020-07-12 MED ORDER — MELOXICAM 15 MG PO TABS: 15.0000 mg | ORAL_TABLET | Freq: Every day | ORAL | 0 refills | Status: DC

## 2020-07-12 MED ORDER — CEPHALEXIN 500 MG PO CAPS: 500.0000 mg | ORAL_CAPSULE | Freq: Two times a day (BID) | ORAL | 0 refills | Status: DC

## 2020-07-18 ENCOUNTER — Telehealth: Payer: Self-pay | Admitting: Podiatry

## 2020-07-18 MED ORDER — MELOXICAM 15 MG PO TABS: 15.0000 mg | ORAL_TABLET | Freq: Every day | ORAL | 0 refills | Status: DC

## 2020-07-18 MED ORDER — KETOROLAC TROMETHAMINE 10 MG PO TABS: 10.0000 mg | ORAL_TABLET | Freq: Four times a day (QID) | ORAL | 0 refills | Status: AC | PRN

## 2020-07-18 MED ORDER — CEPHALEXIN 500 MG PO CAPS: 500.0000 mg | ORAL_CAPSULE | Freq: Two times a day (BID) | ORAL | 0 refills | Status: DC

## 2020-07-18 MED ORDER — ONDANSETRON HCL 4 MG PO TABS: 4.0000 mg | ORAL_TABLET | Freq: Three times a day (TID) | ORAL | 0 refills | Status: DC | PRN

## 2020-07-18 NOTE — Telephone Encounter (Signed)
Patient stated all new prescriptions that were sent in to [facility] will need to resent to another pharmacy due to the fact they no longer accepts patients insurance. I have updated chart to reflect the new pharmacy for patient, Please Advise

## 2020-07-25 ENCOUNTER — Encounter: Payer: Self-pay | Admitting: Family Medicine

## 2020-07-25 ENCOUNTER — Other Ambulatory Visit: Payer: Self-pay

## 2020-07-25 ENCOUNTER — Ambulatory Visit (INDEPENDENT_AMBULATORY_CARE_PROVIDER_SITE_OTHER): Payer: Managed Care, Other (non HMO) | Admitting: Family Medicine

## 2020-07-25 VITALS — BP 122/78 | HR 101 | Temp 98.4°F | Wt 301.0 lb

## 2020-07-25 DIAGNOSIS — Z01818 Encounter for other preprocedural examination: Secondary | ICD-10-CM | POA: Diagnosis not present

## 2020-07-25 NOTE — Progress Notes (Signed)
Subjective:    Patient ID: Tiffany Chambers, female    DOB: 04/22/1976, 45 y.o.   MRN: [id number]  HPI Patient is here today for preoperative clearance.  I have not seen the patient since 2019.  However this is primarily because she has not experienced any medical problems.  Overall she is healthy other than an elevated BMI.  Unfortunately she has been suffering from severe Planter fasciitis in the right foot.  Her podiatrist has recommended surgery to correct the problem however she is here today for clearance for the surgery.  She denies any symptoms of unstable angina.  Specifically she denies any chest pain.  She also denies any orthopnea, paroxysmal nocturnal dyspnea, or dyspnea on exertion.  No one has checked any lab work in the last 2 years however she denies any history of liver problems or kidney problems or severe anemia.  She denies any history of cardiac arrhythmias.  She does take citalopram. Past Medical History:  Diagnosis Date  . Depression   . Reflux    Past Surgical History:  Procedure Laterality Date  . CESAREAN SECTION     Current Outpatient Medications on File Prior to Visit  Medication Sig Dispense Refill  . citalopram (CELEXA) 20 MG tablet     . Esomeprazole Magnesium (NEXIUM 24HR PO) Take by mouth.    Marland Kitchen GIANVI 3-0.02 MG tablet     . meloxicam (MOBIC) 15 MG tablet Take 1 tablet (15 mg total) by mouth daily. 30 tablet 0  . Multiple Vitamin (MULTIVITAMIN) capsule Take 1 capsule by mouth daily.    . cephALEXin (KEFLEX) 500 MG capsule Take 1 capsule (500 mg total) by mouth 2 (two) times daily. (Patient not taking: Reported on 07/25/2020) 14 capsule 0  . dexlansoprazole (DEXILANT) 60 MG capsule take 1    . methylPREDNISolone (MEDROL DOSEPAK) 4 MG TBPK tablet 6 Day Taper Pack. Take as Directed. (Patient not taking: Reported on 07/25/2020) 21 tablet 0  . ondansetron (ZOFRAN) 4 MG tablet Take 1 tablet (4 mg total) by mouth every 8 (eight) hours as needed for nausea or  vomiting. (Patient not taking: Reported on 07/25/2020) 20 tablet 0   Current Facility-Administered Medications on File Prior to Visit  Medication Dose Route Frequency Provider Last Rate Last Admin  . betamethasone acetate-betamethasone sodium phosphate (CELESTONE) injection 3 mg  3 mg Intramuscular Once Gala Lewandowsky M, DPM      . betamethasone acetate-betamethasone sodium phosphate (CELESTONE) injection 3 mg  3 mg Intramuscular Once Felecia Shelling, DPM       Allergies  Allergen Reactions  . Morphine And Related Itching   Social History   Socioeconomic History  . Marital status: Married    Spouse name: Not on file  . Number of children: Not on file  . Years of education: Not on file  . Highest education level: Not on file  Occupational History  . Not on file  Tobacco Use  . Smoking status: Former Smoker    Types: Cigarettes  . Smokeless tobacco: Never Used  Substance and Sexual Activity  . Alcohol use: Yes    Comment: social   . Drug use: No  . Sexual activity: Not on file  Other Topics Concern  . Not on file  Social History Narrative  . Not on file   Social Determinants of Health   Financial Resource Strain: Not on file  Food Insecurity: Not on file  Transportation Needs: Not on file  Physical Activity: Not on  file  Stress: Not on file  Social Connections: Not on file  Intimate Partner Violence: Not on file      Review of Systems  All other systems reviewed and are negative.      Objective:   Physical Exam Vitals reviewed.  Constitutional:      General: She is not in acute distress.    Appearance: Normal appearance. She is obese. She is not ill-appearing, toxic-appearing or diaphoretic.  Neck:     Vascular: No carotid bruit.  Cardiovascular:     Rate and Rhythm: Normal rate and regular rhythm.     Heart sounds: Normal heart sounds. No murmur heard. No friction rub. No gallop.   Pulmonary:     Effort: Pulmonary effort is normal. No respiratory distress.      Breath sounds: Normal breath sounds. No wheezing, rhonchi or rales.  Musculoskeletal:     Right lower leg: No edema.     Left lower leg: No edema.  Neurological:     General: No focal deficit present.     Mental Status: She is alert and oriented to person, place, and time. Mental status is at baseline.           Assessment & Plan:  Preoperative clearance - Plan: CBC with Differential/Platelet, COMPLETE METABOLIC PANEL WITH GFR, EKG 12-Lead  Blood pressure today is outstanding at 122/78.  I will check a CBC to rule out severe anemia that would contraindicate surgery.  I will also check a CMP to evaluate for any renal or hepatic dysfunction.  Given the fact that I do not know the patient extremely well also would like to get an EKG today to evaluate for any potential cardiac arrhythmias especially given the fact that she is on citalopram.  EKG shows normal sinus rhythm with normal intervals and a normal axis with no evidence of ischemia or infarction.  Pending normal lab results I see no contraindication to a low risk outpatient surgery

## 2020-07-26 ENCOUNTER — Encounter: Payer: Self-pay | Admitting: *Deleted

## 2020-07-26 LAB — CBC WITH DIFFERENTIAL/PLATELET
Absolute Monocytes: 434 cells/uL (ref 200–950)
Basophils Absolute: 51 cells/uL (ref 0–200)
Basophils Relative: 0.6 %
Eosinophils Absolute: 145 cells/uL (ref 15–500)
Eosinophils Relative: 1.7 %
HCT: 37.4 % (ref 35.0–45.0)
Hemoglobin: 12.7 g/dL (ref 11.7–15.5)
Lymphs Abs: 2907 cells/uL (ref 850–3900)
MCH: 30.6 pg (ref 27.0–33.0)
MCHC: 34 g/dL (ref 32.0–36.0)
MCV: 90.1 fL (ref 80.0–100.0)
MPV: 9.6 fL (ref 7.5–12.5)
Monocytes Relative: 5.1 %
Neutro Abs: 4964 cells/uL (ref 1500–7800)
Neutrophils Relative %: 58.4 %
Platelets: 414 10*3/uL — ABNORMAL HIGH (ref 140–400)
RBC: 4.15 10*6/uL (ref 3.80–5.10)
RDW: 12.8 % (ref 11.0–15.0)
Total Lymphocyte: 34.2 %
WBC: 8.5 10*3/uL (ref 3.8–10.8)

## 2020-07-26 LAB — COMPLETE METABOLIC PANEL WITH GFR
AG Ratio: 1.4 (calc) (ref 1.0–2.5)
ALT: 10 U/L (ref 6–29)
AST: 14 U/L (ref 10–35)
Albumin: 3.9 g/dL (ref 3.6–5.1)
Alkaline phosphatase (APISO): 55 U/L (ref 31–125)
BUN: 13 mg/dL (ref 7–25)
CO2: 26 mmol/L (ref 20–32)
Calcium: 9.4 mg/dL (ref 8.6–10.2)
Chloride: 101 mmol/L (ref 98–110)
Creat: 0.81 mg/dL (ref 0.50–1.10)
GFR, Est African American: 102 mL/min/{1.73_m2} (ref >=60)
GFR, Est Non African American: 88 mL/min/{1.73_m2} (ref >=60)
Globulin: 2.7 g/dL (calc) (ref 1.9–3.7)
Glucose, Bld: 104 mg/dL — ABNORMAL HIGH (ref 65–99)
Potassium: 4.2 mmol/L (ref 3.5–5.3)
Sodium: 136 mmol/L (ref 135–146)
Total Bilirubin: 0.4 mg/dL (ref 0.2–1.2)
Total Protein: 6.6 g/dL (ref 6.1–8.1)

## 2020-08-05 ENCOUNTER — Telehealth: Payer: Self-pay

## 2020-08-05 NOTE — Progress Notes (Signed)
Reviewed pt chart for pre-op interview noted pt has a BMI 51.67.  Per anesthesia guidelines for ambulatory surgery pt is not candidate for [facility] due to BMI 50.  Called and left message for [place] , OR scheduler for Dr Samuella Cota, informed her pt would need to be moved to main OR.

## 2020-08-05 NOTE — Telephone Encounter (Signed)
DOS 08/14/2020  EPF RT - 16109 HEEL SPUR RESECTION RT - 60454  [employer] EFFECTIVE DATE - 07/06/2018  PLAN DEDUCTIBLE - $500.00 W/ $14.61 MET OUT OF POCKET - $3500.00 W/ $131.77 MET COPAY $0.00 COINSURANCE - 80%  PER AUTOMATED SYSTEM NO AUTH REQUIRED FOR CPT  29893 - CONF# [id number] - CONF # [phone]

## 2020-08-05 NOTE — Progress Notes (Signed)
  Subjective:  Patient ID: Tiffany Chambers, female    DOB: Jul 10, 1975,  MRN: [id number]  Chief Complaint  Patient presents with  . Plantar Fasciitis    Pt states pain is much worse and although injections help briefly they do not provide long term relief.    45 y.o. female presents with the above complaint. History confirmed with patient.   Objective:  Physical Exam: warm, good capillary refill, no trophic changes or ulcerative lesions, normal DP and PT pulses and normal sensory exam. Right Foot: point tenderness over the heel pad and tenderness at the ATFL Right. Negative anterior drawer.  No longer having pain at the posterior Achilles   Assessment:   1. Plantar fasciitis   2. Achilles tendonitis, bilateral   3. Equinus deformity of foot   4. Morbid obesity (HCC)    Plan:  Patient was evaluated and treated and all questions answered.  Achilles Tendonitis, Plantar Fasciitis and Equinus  -At this point I think patient would benefit from surgical invention as she has failed aggressive longstanding conservative care -Patient has failed all conservative therapy and wishes to proceed with surgical intervention. All risks, benefits, and alternatives discussed with patient. No guarantees given. Consent reviewed and signed by patient. -Planned procedures: Endoscopic plantar fasciotomy right, possible heel spur resection -Identified risk factors: Obesity.     No follow-ups on file.

## 2020-08-06 NOTE — Patient Instructions (Addendum)
DUE TO COVID-19 ONLY ONE VISITOR IS ALLOWED TO COME WITH YOU AND STAY IN THE WAITING ROOM ONLY DURING PRE OP AND PROCEDURE DAY OF SURGERY. THE 1 VISITOR  MAY VISIT WITH YOU AFTER SURGERY IN YOUR PRIVATE ROOM DURING VISITING HOURS ONLY!  YOU NEED TO HAVE A COVID 19 TEST ON  2/5___ @__10 :25_____, THIS TEST MUST BE DONE BEFORE SURGERY,  COVID TESTING SITE [address] [place] , IT IS ON THE RIGHT GOING OUT [address] APPROXIMATELY  2 MINUTES PAST [employer] ON THE RIGHT. ONCE YOUR COVID TEST IS COMPLETED,  PLEASE BEGIN THE QUARANTINE INSTRUCTIONS AS OUTLINED IN YOUR HANDOUT.                Tiffany Chambers   Your procedure is scheduled on: 08/14/20   Report to [facility] Main  Entrance   Report to admitting at 12:00 PM      Call this number if you have problems the morning of surgery [phone]    Remember: Do not eat food After Midnight.  You may have clear liquids until 6:00 AM   BRUSH YOUR TEETH MORNING OF SURGERY AND RINSE YOUR MOUTH OUT, NO CHEWING GUM CANDY OR MINTS.     Take these medicines the morning of surgery with A SIP OF WATER: None                                 You may not have any metal on your body including hair pins and              piercings  Do not wear jewelry, make-up, lotions, powders or perfumes, deodorant             Do not wear nail polish on your fingernails.  Do not shave  48 hours prior to surgery.     Do not bring valuables to the hospital. [facility] IS NOT             RESPONSIBLE   FOR VALUABLES.  Contacts, dentures or bridgework may not be worn into surgery.  Leave suitcase in the car. After surgery it may be brought to your room.     Patients discharged the day of surgery will not be allowed to drive home   IF YOU ARE HAVING SURGERY AND GOING HOME THE SAME DAY, YOU MUST HAVE AN ADULT TO DRIVE YOU HOME AND BE WITH YOU FOR 24 HOURS.   YOU MAY GO HOME BY TAXI OR UBER OR ORTHERWISE, BUT AN ADULT  MUST ACCOMPANY YOU HOME AND STAY WITH YOU FOR 24 HOURS.  Name and phone number of your driver:  Special Instructions: N/A              Please read over the following fact sheets you were given: _____________________________________________________________________             [facility] - Preparing for Surgery Before surgery, you can play an important role.  Because skin is not sterile, your skin needs to be as free of germs as possible.  You can reduce the number of germs on your skin by washing with CHG (chlorahexidine gluconate) soap before surgery.  CHG is an antiseptic cleaner which kills germs and bonds with the skin to continue killing germs even after washing. Please DO NOT use if you have an allergy to CHG or antibacterial soaps.  If your skin becomes reddened/irritated  stop using the CHG and inform your nurse when you arrive at Short Stay. Do not shave (including legs and underarms) for at least 48 hours prior to the first CHG shower. . Please follow these instructions carefully:  1.  Shower with CHG Soap the night before surgery and the  morning of Surgery.  2.  If you choose to wash your hair, wash your hair first as usual with your  normal  shampoo.  3.  After you shampoo, rinse your hair and body thoroughly to remove the  shampoo.                                        4.  Use CHG as you would any other liquid soap.  You can apply chg directly  to the skin and wash                       Gently with a scrungie or clean washcloth.  5.  Apply the CHG Soap to your body ONLY FROM THE NECK DOWN.   Do not use on face/ open                           Wound or open sores. Avoid contact with eyes, ears mouth and genitals (private parts).                       Wash face,  Genitals (private parts) with your normal soap.             6.  Wash thoroughly, paying special attention to the area where your surgery  will be performed.  7.  Thoroughly rinse your body with warm water from the neck  down.  8.  DO NOT shower/wash with your normal soap after using and rinsing off  the CHG Soap.             9.  Pat yourself dry with a clean towel.            10.  Wear clean pajamas.            11.  Place clean sheets on your bed the night of your first shower and do not  sleep with pets. Day of Surgery : Do not apply any lotions/deodorants the morning of surgery.  Please wear clean clothes to the hospital/surgery center.  FAILURE TO FOLLOW THESE INSTRUCTIONS MAY RESULT IN THE CANCELLATION OF YOUR SURGERY PATIENT SIGNATURE_________________________________  NURSE SIGNATURE__________________________________  ________________________________________________________________________

## 2020-08-08 ENCOUNTER — Other Ambulatory Visit: Payer: Self-pay

## 2020-08-08 ENCOUNTER — Encounter (HOSPITAL_COMMUNITY): Payer: Self-pay

## 2020-08-08 ENCOUNTER — Encounter (HOSPITAL_COMMUNITY)
Admission: RE | Admit: 2020-08-08 | Discharge: 2020-08-08 | Disposition: A | Discharge status: Home / Self Care | Payer: Managed Care, Other (non HMO) | Source: Ambulatory Visit | Attending: Podiatry | Admitting: Podiatry

## 2020-08-08 DIAGNOSIS — Z01812 Encounter for preprocedural laboratory examination: Secondary | ICD-10-CM | POA: Diagnosis not present

## 2020-08-08 HISTORY — DX: Unspecified osteoarthritis, unspecified site: M19.90

## 2020-08-08 HISTORY — DX: Headache, unspecified: R51.9

## 2020-08-08 HISTORY — DX: Gastro-esophageal reflux disease without esophagitis: K21.9

## 2020-08-08 LAB — CBC
HCT: 37.1 % (ref 36.0–46.0)
Hemoglobin: 12.5 g/dL (ref 12.0–15.0)
MCH: 31 pg (ref 26.0–34.0)
MCHC: 33.7 g/dL (ref 30.0–36.0)
MCV: 92.1 fL (ref 80.0–100.0)
Platelets: 373 10*3/uL (ref 150–400)
RBC: 4.03 MIL/uL (ref 3.87–5.11)
RDW: 12.5 % (ref 11.5–15.5)
WBC: 8.4 10*3/uL (ref 4.0–10.5)
nRBC: 0 % (ref 0.0–0.2)

## 2020-08-08 NOTE — Progress Notes (Signed)
COVID Vaccine Completed:Yes Date COVID Vaccine completed:01/08/20 COVID vaccine manufacturer:   Moderna     PCP - Dr. Karrie Doffing Cardiologist - none  Chest x-ray - no EKG - no Stress Test - no ECHO - no Cardiac Cath - no Pacemaker/ICD device last checked:NA  Sleep Study - no CPAP -   Fasting Blood Sugar - NA Checks Blood Sugar _____ times a day  Blood Thinner Instructions:NA Aspirin Instructions: Last Dose:  Anesthesia review:   Patient denies shortness of breath, fever, cough and chest pain at PAT appointment yes  Patient verbalized understanding of instructions that were given to them at the PAT appointment. Patient was also instructed that they will need to review over the PAT instructions again at home before surgery. Yes Pt,s BMI is 51.6. She can climb 2 flights of stairs, do housework and ADLs without SOB.

## 2020-08-10 ENCOUNTER — Other Ambulatory Visit (HOSPITAL_COMMUNITY)
Admission: RE | Admit: 2020-08-10 | Discharge: 2020-08-10 | Disposition: A | Discharge status: Home / Self Care | Payer: Managed Care, Other (non HMO) | Source: Ambulatory Visit | Attending: Podiatry | Admitting: Podiatry

## 2020-08-10 DIAGNOSIS — Z20822 Contact with and (suspected) exposure to covid-19: Secondary | ICD-10-CM | POA: Diagnosis not present

## 2020-08-10 DIAGNOSIS — Z01812 Encounter for preprocedural laboratory examination: Secondary | ICD-10-CM | POA: Diagnosis present

## 2020-08-10 LAB — SARS CORONAVIRUS 2 (TAT 6-24 HRS): SARS Coronavirus 2: NEGATIVE

## 2020-08-14 ENCOUNTER — Encounter (HOSPITAL_COMMUNITY): Payer: Self-pay | Admitting: Podiatry

## 2020-08-14 ENCOUNTER — Other Ambulatory Visit: Payer: Self-pay | Admitting: Podiatry

## 2020-08-14 ENCOUNTER — Ambulatory Visit (HOSPITAL_COMMUNITY): Payer: Managed Care, Other (non HMO) | Admitting: Registered Nurse

## 2020-08-14 ENCOUNTER — Encounter (HOSPITAL_COMMUNITY)
Admission: RE | Disposition: A | Discharge status: Home / Self Care | Payer: Self-pay | Source: Home / Self Care | Attending: Podiatry

## 2020-08-14 ENCOUNTER — Ambulatory Visit (HOSPITAL_COMMUNITY)
Admission: RE | Admit: 2020-08-14 | Discharge: 2020-08-14 | Disposition: A | Discharge status: Home / Self Care | Payer: Managed Care, Other (non HMO) | Attending: Podiatry | Admitting: Podiatry

## 2020-08-14 DIAGNOSIS — M722 Plantar fascial fibromatosis: Secondary | ICD-10-CM | POA: Insufficient documentation

## 2020-08-14 DIAGNOSIS — M7731 Calcaneal spur, right foot: Secondary | ICD-10-CM | POA: Diagnosis not present

## 2020-08-14 HISTORY — PX: HEEL SPUR RESECTION: SHX6410

## 2020-08-14 HISTORY — PX: PLANTAR FASCIA RELEASE: SHX2239

## 2020-08-14 LAB — CBC
HCT: 36.5 % (ref 36.0–46.0)
Hemoglobin: 12.2 g/dL (ref 12.0–15.0)
MCH: 30.7 pg (ref 26.0–34.0)
MCHC: 33.4 g/dL (ref 30.0–36.0)
MCV: 91.9 fL (ref 80.0–100.0)
Platelets: 360 10*3/uL (ref 150–400)
RBC: 3.97 MIL/uL (ref 3.87–5.11)
RDW: 12.5 % (ref 11.5–15.5)
WBC: 8.7 10*3/uL (ref 4.0–10.5)
nRBC: 0 % (ref 0.0–0.2)

## 2020-08-14 LAB — PREGNANCY, URINE: Preg Test, Ur: NEGATIVE

## 2020-08-14 SURGERY — FASCIOTOMY, PLANTAR, ENDOSCOPIC
Anesthesia: Regional | Site: Heel | Laterality: Right

## 2020-08-14 MED ORDER — CLONIDINE HCL (ANALGESIA) 100 MCG/ML EP SOLN
EPIDURAL | Status: DC | PRN
  Administered 2020-08-14: 100 ug

## 2020-08-14 MED ORDER — PROPOFOL 500 MG/50ML IV EMUL
INTRAVENOUS | Status: DC | PRN
  Administered 2020-08-14: 100 ug/kg/min via INTRAVENOUS

## 2020-08-14 MED ORDER — ONDANSETRON HCL 4 MG/2ML IJ SOLN
INTRAMUSCULAR | Status: AC
  Filled 2020-08-14: qty 2

## 2020-08-14 MED ORDER — LACTATED RINGERS IV SOLN: INTRAVENOUS | Status: DC

## 2020-08-14 MED ORDER — LIDOCAINE 2% (20 MG/ML) 5 ML SYRINGE
INTRAMUSCULAR | Status: DC | PRN
  Administered 2020-08-14: 100 mg via INTRAVENOUS

## 2020-08-14 MED ORDER — DEXAMETHASONE SODIUM PHOSPHATE 10 MG/ML IJ SOLN
INTRAMUSCULAR | Status: AC
  Filled 2020-08-14: qty 1

## 2020-08-14 MED ORDER — PROPOFOL 1000 MG/100ML IV EMUL
INTRAVENOUS | Status: AC
  Filled 2020-08-14: qty 100

## 2020-08-14 MED ORDER — DEXAMETHASONE SODIUM PHOSPHATE 4 MG/ML IJ SOLN
INTRAMUSCULAR | Status: DC | PRN
  Administered 2020-08-14: 4 mg via PERINEURAL

## 2020-08-14 MED ORDER — ROPIVACAINE HCL 7.5 MG/ML IJ SOLN
INTRAMUSCULAR | Status: DC | PRN
  Administered 2020-08-14: 25 mL via PERINEURAL

## 2020-08-14 MED ORDER — CHLORHEXIDINE GLUCONATE 0.12 % MT SOLN: 15.0000 mL | Freq: Once | OROMUCOSAL | Status: AC

## 2020-08-14 MED ORDER — ORAL CARE MOUTH RINSE
15.0000 mL | Freq: Once | OROMUCOSAL | Status: AC
  Administered 2020-08-14: 15 mL via OROMUCOSAL

## 2020-08-14 MED ORDER — FENTANYL CITRATE (PF) 100 MCG/2ML IJ SOLN
INTRAMUSCULAR | Status: AC
  Administered 2020-08-14: 50 ug
  Filled 2020-08-14: qty 2

## 2020-08-14 MED ORDER — MIDAZOLAM HCL 2 MG/2ML IJ SOLN
INTRAMUSCULAR | Status: AC
  Administered 2020-08-14: 2 mg via INTRAVENOUS
  Filled 2020-08-14: qty 2

## 2020-08-14 MED ORDER — PROPOFOL 10 MG/ML IV BOLUS
INTRAVENOUS | Status: DC | PRN
  Administered 2020-08-14 (×2): 20 mg via INTRAVENOUS

## 2020-08-14 MED ORDER — MIDAZOLAM HCL 2 MG/2ML IJ SOLN: 1.0000 mg | INTRAMUSCULAR | Status: DC

## 2020-08-14 MED ORDER — FENTANYL CITRATE (PF) 100 MCG/2ML IJ SOLN: 50.0000 ug | INTRAMUSCULAR | Status: DC

## 2020-08-14 MED ORDER — LIDOCAINE HCL (PF) 2 % IJ SOLN
INTRAMUSCULAR | Status: AC
  Filled 2020-08-14: qty 5

## 2020-08-14 MED ORDER — CEFAZOLIN SODIUM-DEXTROSE 2-4 GM/100ML-% IV SOLN
2.0000 g | INTRAVENOUS | Status: AC
  Administered 2020-08-14: 2 g via INTRAVENOUS
  Filled 2020-08-14: qty 100

## 2020-08-14 MED ORDER — ONDANSETRON HCL 4 MG/2ML IJ SOLN
INTRAMUSCULAR | Status: DC | PRN
  Administered 2020-08-14: 4 mg via INTRAVENOUS

## 2020-08-14 MED ORDER — 0.9 % SODIUM CHLORIDE (POUR BTL) OPTIME
TOPICAL | Status: DC | PRN
  Administered 2020-08-14: 1000 mL

## 2020-08-14 SURGICAL SUPPLY — 50 items
APL PRP STRL LF DISP 70% ISPRP (MISCELLANEOUS) ×2
APL SKNCLS STERI-STRIP NONHPOA (GAUZE/BANDAGES/DRESSINGS) ×2
APL SWBSTK 6 STRL LF DISP (MISCELLANEOUS)
APPLICATOR COTTON TIP 6 STRL (MISCELLANEOUS) IMPLANT
APPLICATOR COTTON TIP 6IN STRL (MISCELLANEOUS) IMPLANT
BANDAGE ESMARK 6X9 LF (GAUZE/BANDAGES/DRESSINGS) IMPLANT
BENZOIN TINCTURE PRP APPL 2/3 (GAUZE/BANDAGES/DRESSINGS) ×3 IMPLANT
BLADE CLIPPER SENSICLIP SURGIC (BLADE) IMPLANT
BLADE SURG 15 STRL LF DISP TIS (BLADE) ×2 IMPLANT
BLADE SURG 15 STRL SS (BLADE) ×3
BLADE TRIANGLE EPF/EGR ENDO (BLADE) ×3 IMPLANT
BNDG CMPR 9X4 STRL LF SNTH (GAUZE/BANDAGES/DRESSINGS)
BNDG CMPR 9X6 STRL LF SNTH (GAUZE/BANDAGES/DRESSINGS)
BNDG ELASTIC 3X5.8 VLCR STR LF (GAUZE/BANDAGES/DRESSINGS) ×1 IMPLANT
BNDG ELASTIC 4X5.8 VLCR STR LF (GAUZE/BANDAGES/DRESSINGS) ×3 IMPLANT
BNDG ESMARK 4X9 LF (GAUZE/BANDAGES/DRESSINGS) IMPLANT
BNDG ESMARK 6X9 LF (GAUZE/BANDAGES/DRESSINGS)
BNDG GAUZE ELAST 4 BULKY (GAUZE/BANDAGES/DRESSINGS) ×3 IMPLANT
BUR SHORT TAPERED (BURR) ×1 IMPLANT
CHLORAPREP W/TINT 26 (MISCELLANEOUS) ×3 IMPLANT
COVER WAND RF STERILE (DRAPES) ×3 IMPLANT
DECANTER SPIKE VIAL GLASS SM (MISCELLANEOUS) IMPLANT
DRAPE EXTREMITY T 121X128X90 (DISPOSABLE) ×3 IMPLANT
DRAPE OEC MINIVIEW 54X84 (DRAPES) ×3 IMPLANT
DRAPE U-SHAPE 47X51 STRL (DRAPES) ×3 IMPLANT
DRSG EMULSION OIL 3X3 NADH (GAUZE/BANDAGES/DRESSINGS) ×3 IMPLANT
DRSG PAD ABDOMINAL 8X10 ST (GAUZE/BANDAGES/DRESSINGS) ×6 IMPLANT
ELECT REM PT RETURN 15FT ADLT (MISCELLANEOUS) ×3 IMPLANT
GAUZE SPONGE 4X4 12PLY STRL (GAUZE/BANDAGES/DRESSINGS) IMPLANT
GAUZE XEROFORM 1X8 LF (GAUZE/BANDAGES/DRESSINGS) ×1 IMPLANT
GLOVE SRG 8 PF TXTR STRL LF DI (GLOVE) ×2 IMPLANT
GLOVE SURG LTX SZ7.5 (GLOVE) ×6 IMPLANT
GLOVE SURG UNDER POLY LF SZ8 (GLOVE) ×3
GOWN STRL REUS W/TWL LRG LVL3 (GOWN DISPOSABLE) ×9 IMPLANT
HOLDER KNEE FOAM BLUE (MISCELLANEOUS) IMPLANT
KIT BASIN OR (CUSTOM PROCEDURE TRAY) ×3 IMPLANT
KIT TURNOVER CYSTO (KITS) ×3 IMPLANT
NEEDLE HYPO 22GX1.5 SAFETY (NEEDLE) IMPLANT
NS IRRIG 1000ML POUR BTL (IV SOLUTION) ×3 IMPLANT
PACK ARTHROSCOPY DSU (CUSTOM PROCEDURE TRAY) ×3 IMPLANT
PAD ARMBOARD 7.5X6 YLW CONV (MISCELLANEOUS) ×6 IMPLANT
PAD CAST 4YDX4 CTTN HI CHSV (CAST SUPPLIES) ×2 IMPLANT
PADDING CAST COTTON 4X4 STRL (CAST SUPPLIES) ×3
SPONGE LAP 4X18 RFD (DISPOSABLE) ×3 IMPLANT
STAPLER VISISTAT (STAPLE) ×1 IMPLANT
STRIP CLOSURE SKIN 1/2X4 (GAUZE/BANDAGES/DRESSINGS) ×3 IMPLANT
SUT ETHILON 4 0 PS 2 18 (SUTURE) IMPLANT
SUT VIC AB 3-0 FS2 27 (SUTURE) ×3 IMPLANT
SYR CONTROL 10ML LL (SYRINGE) IMPLANT
WATER STERILE IRR 1000ML POUR (IV SOLUTION) ×3 IMPLANT

## 2020-08-14 NOTE — Anesthesia Postprocedure Evaluation (Signed)
Anesthesia Post Note  Patient: Tiffany Chambers  Procedure(s) Performed: ENDOSCOPIC PLANTAR FASCIOTOMY (Right Foot) POSSIBLE HEEL SPUR RESECTION (Right Heel)     Anesthesia Post Evaluation No complications documented.  Last Vitals:  Vitals:   08/14/20 1545 08/14/20 1638  BP: (!) 147/93 (!) 147/93  Pulse: 76 79  Resp: 19 19  Temp:  36.7 C  SpO2: 99% 100%    Last Pain:  Vitals:   08/14/20 1638  TempSrc: Oral  PainSc: 0-No pain                 Lewie Loron

## 2020-08-14 NOTE — Op Note (Signed)
  Patient Name: Tiffany Chambers DOB: 01-09-1976  MRN: [id number]   Date of Surgery: 08/14/2020  Surgeon: Dr. Ventura Sellers, DPM Assistants: None  Pre-operative Diagnosis:  Pre-Op Diagnosis Codes:    * Plantar fasciitis [M72.2] Post-operative Diagnosis:  Post-Op Diagnosis Codes:    * Plantar fasciitis [M72.2] Procedures:  1) Endoscopic plantar fasciotomy, right  2) Heel spur resection Pathology/Specimens: * No specimens in log * Anesthesia: Regional, general Hemostasis:  Total Tourniquet Time Documented: Calf (Right) - 34 minutes Total: Calf (Right) - 34 minutes  Estimated Blood Loss: 10 mL Materials: * No implants in log * Medications: none Complications: none  Indications for Procedure:  This is a 45 y.o. female with chronic plantar fasciitis. She presents today for surgical intervention after failing conservative therapy.   Procedure in Detail: Patient was identified in pre-operative holding area. Formal consent was signed and the right lower extremity was marked. Patient was brought back to the operating room. Anesthesia was induced. The extremity was prepped and draped in the usual sterile fashion. Timeout was taken to confirm patient name, laterality, and procedure prior to incision.   Attention was then directed to the plantar heel. A small incision was made at the medial aspect of the calcaneus about the area of the plantar fascia.  An elevator was used to release a soft tissue plane inferior to the plantar fascia a trocar and cannula was placed inferior to the plantar fascia.  A 4 mm endoscope was then placed through the cannula and the plantar fascia was visualized.  Under direct visualization the medial one third of the plantar fascia was incised with a hook blade.  After ensuring that the fascia was completely incised the trocar and cannula was removed.    Under direct fluoroscopic visualization the calcaneal heel spur was then thoroughly reduced with a power rasp.  Fluoroscopic images confirmed reduction.  The foot was then dressed with xeroform, 4x4, kerlix, and ACE. Patient tolerated the procedure well.  Disposition: Following a period of post-operative monitoring, patient will be transferred home.

## 2020-08-14 NOTE — H&P (Signed)
  Subjective:  Patient ID: Tiffany Chambers, female    DOB: [birth year],  MRN: [id number]  No chief complaint on file.   45 y.o. female seen in pre-op prior to surgery. Received nerve block. Denies questions or concerns.  Objective:  Physical Exam: warm, good capillary refill, no trophic changes or ulcerative lesions, normal DP and PT pulses and normal sensory exam. Right Foot: point tenderness over the heel pad and tenderness at the ATFL Right. Negative anterior drawer.  No longer having pain at the posterior Achilles   Assessment:   1. Plantar fasciitis   2. Plantar fasciitis    Plan:  Patient was evaluated and treated and all questions answered.  Achilles Tendonitis, Plantar Fasciitis and Equinus  -Proceed to OR today. -See PCP H&P for clearance for surgery. -Consent reviewed and marked. -Planned procedures: Endoscopic plantar fasciotomy right, possible heel spur resection -Identified risk factors: Obesity.  No follow-ups on file.

## 2020-08-14 NOTE — Transfer of Care (Signed)
Immediate Anesthesia Transfer of Care Note  Patient: Tiffany Chambers  Procedure(s) Performed: ENDOSCOPIC PLANTAR FASCIOTOMY (Right Foot) POSSIBLE HEEL SPUR RESECTION (Right Heel)  Patient Location: PACU  Anesthesia Type:Regional  Level of Consciousness: awake  Airway & Oxygen Therapy: Patient Spontanous Breathing  Post-op Assessment: Report given to RN and Post -op Vital signs reviewed and stable  Post vital signs: Reviewed and stable  Last Vitals:  Vitals Value Taken Time  BP 126/73 08/14/20 1507  Temp    Pulse 77 08/14/20 1513  Resp 14 08/14/20 1513  SpO2 100 % 08/14/20 1513  Vitals shown include unvalidated device data.  Last Pain:  Vitals:   08/14/20 1216  TempSrc: Oral         Complications: No complications documented.

## 2020-08-14 NOTE — Anesthesia Procedure Notes (Signed)
Anesthesia Regional Block: Popliteal block   Pre-Anesthetic Checklist: ,, timeout performed, Correct Patient, Correct Site, Correct Laterality, Correct Procedure, Correct Position, site marked, Risks and benefits discussed,  Surgical consent,  Pre-op evaluation,  At surgeon's request and post-op pain management  Laterality: Right  Prep: chloraprep       Needles:  Injection technique: Single-shot  Needle Type: Stimiplex     Needle Length: 10cm  Needle Gauge: 21     Additional Needles:   Procedures:,,,, ultrasound used (permanent image in chart),,,,  Motor weakness within 5 minutes.   Nerve Stimulator or Paresthesia:  Response: Plantar flexion/toe flexion, 0.5 mA,   Additional Responses:   Narrative:  Start time: 08/14/2020 1:12 PM End time: 08/14/2020 1:19 PM Injection made incrementally with aspirations every 5 mL.  Performed by: Personally  Anesthesiologist: Lewie Loron, MD  Additional Notes: Nerve located and needle positioned with direct ultrasound guidance. Good perineural spread. Patient tolerated well.

## 2020-08-14 NOTE — Discharge Instructions (Signed)
General Anesthesia, Adult, Care After This sheet gives you information about how to care for yourself after your procedure. Your health care provider may also give you more specific instructions. If you have problems or questions, contact your health care provider. What can I expect after the procedure? After the procedure, the following side effects are common:  Pain or discomfort at the IV site.  Nausea.  Vomiting.  Sore throat.  Trouble concentrating.  Feeling cold or chills.  Feeling weak or tired.  Sleepiness and fatigue.  Soreness and body aches. These side effects can affect parts of the body that were not involved in surgery. Follow these instructions at home: For the time period you were told by your health care provider:  Rest.  Do not participate in activities where you could fall or become injured.  Do not drive or use machinery.  Do not drink alcohol.  Do not take sleeping pills or medicines that cause drowsiness.  Do not make important decisions or sign legal documents.  Do not take care of children on your own.   Eating and drinking  Follow any instructions from your health care provider about eating or drinking restrictions.  When you feel hungry, start by eating small amounts of foods that are soft and easy to digest (bland), such as toast. Gradually return to your regular diet.  Drink enough fluid to keep your urine pale yellow.  If you vomit, rehydrate by drinking water, juice, or clear broth. General instructions  If you have sleep apnea, surgery and certain medicines can increase your risk for breathing problems. Follow instructions from your health care provider about wearing your sleep device: ? Anytime you are sleeping, including during daytime naps. ? While taking prescription pain medicines, sleeping medicines, or medicines that make you drowsy.  Have a responsible adult stay with you for the time you are told. It is important to have  someone help care for you until you are awake and alert.  Return to your normal activities as told by your health care provider. Ask your health care provider what activities are safe for you.  Take over-the-counter and prescription medicines only as told by your health care provider.  If you smoke, do not smoke without supervision.  Keep all follow-up visits as told by your health care provider. This is important. Contact a health care provider if:  You have nausea or vomiting that does not get better with medicine.  You cannot eat or drink without vomiting.  You have pain that does not get better with medicine.  You are unable to pass urine.  You develop a skin rash.  You have a fever.  You have redness around your IV site that gets worse. Get help right away if:  You have difficulty breathing.  You have chest pain.  You have blood in your urine or stool, or you vomit blood. Summary  After the procedure, it is common to have a sore throat or nausea. It is also common to feel tired.  Have a responsible adult stay with you for the time you are told. It is important to have someone help care for you until you are awake and alert.  When you feel hungry, start by eating small amounts of foods that are soft and easy to digest (bland), such as toast. Gradually return to your regular diet.  Drink enough fluid to keep your urine pale yellow.  Return to your normal activities as told by your health care provider.  Ask your health care provider what activities are safe for you. This information is not intended to replace advice given to you by your health care provider. Make sure you discuss any questions you have with your health care provider. Document Revised: 03/07/2020 Document Reviewed: 10/05/2019 Elsevier Patient Education  2021 Elsevier Inc.   After Surgery Instructions   1) If you are recuperating from surgery anywhere other than home, please be sure to leave [place] the  number where you can be reached.  2) Go directly home and rest.  3) Keep the operated foot(feet) elevated six inches above the hip when sitting or lying down. This will help control swelling and pain.  4) Support the elevated foot and leg with pillows. DO NOT PLACE PILLOWS UNDER THE KNEE.  5) DO NOT REMOVE or get your bandages WET, unless you were given different instructions by your doctor to do so. This increases the risk of infection.  6) Wear your surgical shoe or surgical boot at all times when you are up on your feet.  7) A limited amount of pain and swelling may occur. The skin may take on a bruised appearance. DO NOT BE ALARMED, THIS IS NORMAL.  8) For slight pain and swelling, apply an ice pack directly over the bandages for 15 minutes only out of each hour of the day. Continue until seen in the office for your first post op visit. DO NOT APPLY ANY FORM OF HEAT TO THE AREA.  9) Have prescriptions filled immediately and take as directed.  10) Drink lots of liquids, water and juice to stay hydrated.  11) CALL IMMEDIATELY IF:  *Bleeding continues until the following day of surgery  *Pain increases and/or does not respond to medication  *Bandages or cast appears to tight  *If your bandage gets wet  *Trip, fall or stump your surgical foot  *If your temperature goes above 101  *If you have ANY questions at all  12) You are expected to be weightbearing after your surgery.   If you need to reach the nurse for any reason, please call: [place]: [phone] [place]: [phone] [place]: [phone]

## 2020-08-14 NOTE — Anesthesia Preprocedure Evaluation (Signed)
Anesthesia Evaluation  Patient identified by MRN, date of birth, ID band Patient awake    Reviewed: Allergy & Precautions, NPO status , Patient's Chart, lab work & pertinent test results  Airway Mallampati: II  TM Distance: >3 FB Neck ROM: Full    Dental  (+) Dental Advisory Given   Pulmonary neg pulmonary ROS, Patient abstained from smoking., former smoker,    Pulmonary exam normal breath sounds clear to auscultation       Cardiovascular negative cardio ROS Normal cardiovascular exam Rhythm:Regular Rate:Normal     Neuro/Psych  Headaches, PSYCHIATRIC DISORDERS Depression    GI/Hepatic Neg liver ROS, GERD  ,  Endo/Other  Morbid obesity  Renal/GU negative Renal ROS     Musculoskeletal  (+) Arthritis ,   Abdominal (+) + obese,   Peds  Hematology negative hematology ROS (+)   Anesthesia Other Findings   Reproductive/Obstetrics                            Anesthesia Physical Anesthesia Plan  ASA: III  Anesthesia Plan: Regional   Post-op Pain Management:    Induction: Intravenous  PONV Risk Score and Plan: Propofol infusion, Treatment may vary due to age or medical condition, Midazolam and TIVA  Airway Management Planned: Natural Airway  Additional Equipment: None  Intra-op Plan:   Post-operative Plan:   Informed Consent: I have reviewed the patients History and Physical, chart, labs and discussed the procedure including the risks, benefits and alternatives for the proposed anesthesia with the patient or authorized representative who has indicated his/her understanding and acceptance.     Dental advisory given  Plan Discussed with: CRNA  Anesthesia Plan Comments:        Anesthesia Quick Evaluation

## 2020-08-14 NOTE — Progress Notes (Signed)
Assisted Dr. Germeroth with right, ultrasound guided, popliteal block. Side rails up, monitors on throughout procedure. See vital signs in flow sheet. Tolerated Procedure well. 

## 2020-08-15 ENCOUNTER — Encounter (HOSPITAL_COMMUNITY): Payer: Self-pay | Admitting: Podiatry

## 2020-08-20 ENCOUNTER — Ambulatory Visit (INDEPENDENT_AMBULATORY_CARE_PROVIDER_SITE_OTHER): Payer: Managed Care, Other (non HMO) | Admitting: Podiatry

## 2020-08-20 ENCOUNTER — Ambulatory Visit (INDEPENDENT_AMBULATORY_CARE_PROVIDER_SITE_OTHER): Payer: Managed Care, Other (non HMO)

## 2020-08-20 ENCOUNTER — Other Ambulatory Visit: Payer: Self-pay

## 2020-08-20 DIAGNOSIS — M216X9 Other acquired deformities of unspecified foot: Secondary | ICD-10-CM | POA: Diagnosis not present

## 2020-08-20 DIAGNOSIS — M722 Plantar fascial fibromatosis: Secondary | ICD-10-CM | POA: Diagnosis not present

## 2020-08-20 DIAGNOSIS — R928 Other abnormal and inconclusive findings on diagnostic imaging of breast: Secondary | ICD-10-CM | POA: Insufficient documentation

## 2020-08-20 NOTE — Progress Notes (Signed)
  Subjective:  Patient ID: Tiffany Chambers, female    DOB: 09-18-1975,  MRN: [id number]  Chief Complaint  Patient presents with  . Routine Post Op    POV#1 -pt denies N/V/F/Ch -dressing intact but with bleeding through the bandage -pt states," the pain is fine, really no pain until yesterday casue I did a lot; 6/10 shooting." -soreness at arch and back of heel Tx: boot, elevation -w/ numbness at plantar and toes    DOS: 08/14/20 Procedure: Gastrocnemius Recession, heel spur resection right  45 y.o. female presents with the above complaint. History confirmed with patient.   Objective:  Physical Exam: tenderness at the surgical site, local edema noted and calf supple, nontender. Some slight hypoesthesia plantar arch. Incision: healing well, no significant drainage, no dehiscence, no significant erythema  No images are attached to the encounter.  Radiographs: X-ray of the right foot: successful reduction of plantar spur   Assessment:   1. Plantar fasciitis   2. Equinus deformity of foot     Plan:  Patient was evaluated and treated and all questions answered.  Post-operative State -XR reviewed with patient -Ok to start showering at this time. Advised they cannot soak. -Dressing applied consisting of band-aid -WBAT in Surgical shoe -Surgical shoe dispensed  -Does appear to have some mild neuropraxia which should resolve with time.  No follow-ups on file.

## 2020-08-22 DIAGNOSIS — M79676 Pain in unspecified toe(s): Secondary | ICD-10-CM

## 2020-09-13 ENCOUNTER — Encounter: Payer: Self-pay | Admitting: Podiatry

## 2020-09-13 ENCOUNTER — Other Ambulatory Visit: Payer: Self-pay

## 2020-09-13 ENCOUNTER — Ambulatory Visit (INDEPENDENT_AMBULATORY_CARE_PROVIDER_SITE_OTHER): Payer: Managed Care, Other (non HMO) | Admitting: Podiatry

## 2020-09-13 DIAGNOSIS — M216X9 Other acquired deformities of unspecified foot: Secondary | ICD-10-CM

## 2020-09-13 DIAGNOSIS — M722 Plantar fascial fibromatosis: Secondary | ICD-10-CM

## 2020-09-13 NOTE — Progress Notes (Signed)
  Subjective:  Patient ID: Tiffany Chambers, female    DOB: Feb 24, 1976,  MRN: [id number]  Chief Complaint  Patient presents with  . Routine Post Op    POV #2 DOS 08/14/2020 OPEN VS ENDOSCOPIC, POSS HEEL SPUR RESECTION  "I can't really stand for long periods without a lot of pain, overall doing okay though"   DOS: 08/14/20 Procedure: Gastrocnemius Recession, heel spur resection right  45 y.o. female presents with the above complaint. History confirmed with patient. States she is having more pain than she expected, especially when driving. Now afraid to drive because it hurt her so much.  Objective:  Physical Exam: tenderness at the surgical site, more central heel than medial, local edema noted and calf supple, nontender.  Incision: healing well, no significant drainage, no dehiscence, no significant erythema  No images are attached to the encounter.  Assessment:   1. Plantar fasciitis   2. Equinus deformity of foot     Plan:  Patient was evaluated and treated and all questions answered.  Post-operative State -Sutures removed. Band-aid and ointment applied. Pt to apply daily. -Slowly improving. I think this is partially because of inability to wear the boot, and innapropriate boot dispensed at the hospital. She is having more pain at the heel spur reduction site than the EPF site. She is walking well in the surgical shoe. Pain should continue to improve. -Will extend out of work. Plan for an extra 4-6 weeks out  Return in about 2 weeks (around 09/27/2020) for Post-Op (No XRs).

## 2020-10-01 ENCOUNTER — Encounter: Payer: Managed Care, Other (non HMO) | Admitting: Podiatry

## 2020-10-22 ENCOUNTER — Encounter: Payer: Managed Care, Other (non HMO) | Admitting: Podiatry

## 2020-10-29 ENCOUNTER — Encounter: Payer: Self-pay | Admitting: Podiatry

## 2020-10-29 ENCOUNTER — Ambulatory Visit (INDEPENDENT_AMBULATORY_CARE_PROVIDER_SITE_OTHER): Payer: Managed Care, Other (non HMO) | Admitting: Podiatry

## 2020-10-29 ENCOUNTER — Other Ambulatory Visit: Payer: Self-pay

## 2020-10-29 DIAGNOSIS — M722 Plantar fascial fibromatosis: Secondary | ICD-10-CM

## 2020-10-29 DIAGNOSIS — M216X9 Other acquired deformities of unspecified foot: Secondary | ICD-10-CM

## 2020-10-29 NOTE — Progress Notes (Signed)
  Subjective:  Patient ID: Tiffany Chambers, female    DOB: [birth year],  MRN: [id number]  Chief Complaint  Patient presents with  . Routine Post Op    POV #3 NO XRAY- DOS 08/14/2020 OPEN VS ENDOSCOPIC, POSS HEEL SPUR RESECTION). Pt states she is doing well but is experiencing more soreness in foot and ankle due to increased activity.    DOS: 08/14/20 Procedure: Gastrocnemius Recession, heel spur resection right  45 y.o. female presents with the above complaint. History confirmed with patient.  Objective:  Physical Exam: no POP medial calc tuber. Ankle DF only to 0 degrees  Incision: healed.  No images are attached to the encounter.  Assessment:   1. Plantar fasciitis   2. Equinus deformity of foot     Plan:  Patient was evaluated and treated and all questions answered.  Post-operative State -Doing quite well still has some tightness I think her pain will resolve once she works on this. -Ok to continue normal shoegear. -Ok to return to work as planned.  Return in about 6 weeks (around 12/10/2020) for Right, Plantar fasciitis.

## 2020-10-29 NOTE — Patient Instructions (Signed)

## 2020-12-07 ENCOUNTER — Inpatient Hospital Stay (HOSPITAL_COMMUNITY)
Admission: EM | Admit: 2020-12-07 | Discharge: 2020-12-11 | DRG: 327 | Disposition: A | Discharge status: Home / Self Care | Payer: Managed Care, Other (non HMO) | Attending: Thoracic Surgery (Cardiothoracic Vascular Surgery) | Admitting: Thoracic Surgery (Cardiothoracic Vascular Surgery)

## 2020-12-07 ENCOUNTER — Emergency Department (HOSPITAL_COMMUNITY): Payer: Managed Care, Other (non HMO)

## 2020-12-07 ENCOUNTER — Other Ambulatory Visit: Payer: Self-pay

## 2020-12-07 DIAGNOSIS — R1011 Right upper quadrant pain: Secondary | ICD-10-CM | POA: Diagnosis present

## 2020-12-07 DIAGNOSIS — Z20822 Contact with and (suspected) exposure to covid-19: Secondary | ICD-10-CM | POA: Diagnosis present

## 2020-12-07 DIAGNOSIS — F32A Depression, unspecified: Secondary | ICD-10-CM | POA: Diagnosis present

## 2020-12-07 DIAGNOSIS — Z8249 Family history of ischemic heart disease and other diseases of the circulatory system: Secondary | ICD-10-CM

## 2020-12-07 DIAGNOSIS — K449 Diaphragmatic hernia without obstruction or gangrene: Secondary | ICD-10-CM | POA: Diagnosis present

## 2020-12-07 DIAGNOSIS — R519 Headache, unspecified: Secondary | ICD-10-CM | POA: Diagnosis not present

## 2020-12-07 DIAGNOSIS — R000 Tachycardia, unspecified: Secondary | ICD-10-CM | POA: Diagnosis not present

## 2020-12-07 DIAGNOSIS — Z87891 Personal history of nicotine dependence: Secondary | ICD-10-CM | POA: Diagnosis not present

## 2020-12-07 DIAGNOSIS — Z6841 Body Mass Index (BMI) 40.0 and over, adult: Secondary | ICD-10-CM

## 2020-12-07 DIAGNOSIS — J9811 Atelectasis: Secondary | ICD-10-CM | POA: Diagnosis not present

## 2020-12-07 DIAGNOSIS — K219 Gastro-esophageal reflux disease without esophagitis: Secondary | ICD-10-CM

## 2020-12-07 DIAGNOSIS — Z9889 Other specified postprocedural states: Secondary | ICD-10-CM

## 2020-12-07 DIAGNOSIS — Z885 Allergy status to narcotic agent status: Secondary | ICD-10-CM

## 2020-12-07 DIAGNOSIS — Z8719 Personal history of other diseases of the digestive system: Secondary | ICD-10-CM

## 2020-12-07 LAB — CBC
HCT: 35.4 % — ABNORMAL LOW (ref 36.0–46.0)
Hemoglobin: 11.8 g/dL — ABNORMAL LOW (ref 12.0–15.0)
MCH: 30.6 pg (ref 26.0–34.0)
MCHC: 33.3 g/dL (ref 30.0–36.0)
MCV: 91.9 fL (ref 80.0–100.0)
Platelets: 325 10*3/uL (ref 150–400)
RBC: 3.85 MIL/uL — ABNORMAL LOW (ref 3.87–5.11)
RDW: 12.6 % (ref 11.5–15.5)
WBC: 13.2 10*3/uL — ABNORMAL HIGH (ref 4.0–10.5)
nRBC: 0 % (ref 0.0–0.2)

## 2020-12-07 LAB — CBC WITH DIFFERENTIAL/PLATELET
Abs Immature Granulocytes: 0.03 10*3/uL (ref 0.00–0.07)
Basophils Absolute: 0.1 10*3/uL (ref 0.0–0.1)
Basophils Relative: 1 %
Eosinophils Absolute: 0.1 10*3/uL (ref 0.0–0.5)
Eosinophils Relative: 1 %
HCT: 36.6 % (ref 36.0–46.0)
Hemoglobin: 12.3 g/dL (ref 12.0–15.0)
Immature Granulocytes: 0 %
Lymphocytes Relative: 30 %
Lymphs Abs: 2.9 10*3/uL (ref 0.7–4.0)
MCH: 30.6 pg (ref 26.0–34.0)
MCHC: 33.6 g/dL (ref 30.0–36.0)
MCV: 91 fL (ref 80.0–100.0)
Monocytes Absolute: 0.5 10*3/uL (ref 0.1–1.0)
Monocytes Relative: 5 %
Neutro Abs: 6.1 10*3/uL (ref 1.7–7.7)
Neutrophils Relative %: 63 %
Platelets: 396 10*3/uL (ref 150–400)
RBC: 4.02 MIL/uL (ref 3.87–5.11)
RDW: 12.5 % (ref 11.5–15.5)
WBC: 9.8 10*3/uL (ref 4.0–10.5)
nRBC: 0 % (ref 0.0–0.2)

## 2020-12-07 LAB — CREATININE, SERUM
Creatinine, Ser: 0.74 mg/dL (ref 0.44–1.00)
GFR, Estimated: >60 mL/min (ref >60)

## 2020-12-07 LAB — HIV ANTIBODY (ROUTINE TESTING W REFLEX): HIV Screen 4th Generation wRfx: NONREACTIVE

## 2020-12-07 LAB — RESP PANEL BY RT-PCR (FLU A&B, COVID) ARPGX2
Influenza A by PCR: NEGATIVE
Influenza B by PCR: NEGATIVE
SARS Coronavirus 2 by RT PCR: NEGATIVE

## 2020-12-07 LAB — COMPREHENSIVE METABOLIC PANEL
ALT: 12 U/L (ref 0–44)
AST: 18 U/L (ref 15–41)
Albumin: 3.6 g/dL (ref 3.5–5.0)
Alkaline Phosphatase: 54 U/L (ref 38–126)
Anion gap: 11 (ref 5–15)
BUN: 13 mg/dL (ref 6–20)
CO2: 20 mmol/L — ABNORMAL LOW (ref 22–32)
Calcium: 9.4 mg/dL (ref 8.9–10.3)
Chloride: 105 mmol/L (ref 98–111)
Creatinine, Ser: 0.74 mg/dL (ref 0.44–1.00)
GFR, Estimated: >60 mL/min (ref >60)
Glucose, Bld: 116 mg/dL — ABNORMAL HIGH (ref 70–99)
Potassium: 4.1 mmol/L (ref 3.5–5.1)
Sodium: 136 mmol/L (ref 135–145)
Total Bilirubin: 0.3 mg/dL (ref 0.3–1.2)
Total Protein: 7.1 g/dL (ref 6.5–8.1)

## 2020-12-07 LAB — HCG, SERUM, QUALITATIVE: Preg, Serum: NEGATIVE

## 2020-12-07 LAB — LIPASE, BLOOD: Lipase: 33 U/L (ref 11–51)

## 2020-12-07 MED ORDER — HYDROMORPHONE HCL 1 MG/ML IJ SOLN
0.5000 mg | Freq: Once | INTRAMUSCULAR | Status: DC
Start: 2020-12-07 — End: 2020-12-07

## 2020-12-07 MED ORDER — HYDROMORPHONE HCL 1 MG/ML IJ SOLN
1.0000 mg | Freq: Once | INTRAMUSCULAR | Status: DC
  Filled 2020-12-07: qty 1

## 2020-12-07 MED ORDER — ENOXAPARIN SODIUM 40 MG/0.4ML IJ SOSY
40.0000 mg | PREFILLED_SYRINGE | INTRAMUSCULAR | Status: DC
  Administered 2020-12-08: 40 mg via SUBCUTANEOUS
  Filled 2020-12-07 (×2): qty 0.4

## 2020-12-07 MED ORDER — HYDROMORPHONE HCL 1 MG/ML IJ SOLN
1.0000 mg | INTRAMUSCULAR | Status: DC | PRN
  Administered 2020-12-07 – 2020-12-09 (×7): 1 mg via INTRAVENOUS
  Filled 2020-12-07 (×7): qty 1

## 2020-12-07 MED ORDER — HYDROMORPHONE HCL 1 MG/ML IJ SOLN
1.0000 mg | INTRAMUSCULAR | Status: DC | PRN
  Administered 2020-12-07 (×2): 1 mg via INTRAVENOUS
  Filled 2020-12-07: qty 1

## 2020-12-07 MED ORDER — FENTANYL CITRATE (PF) 100 MCG/2ML IJ SOLN
100.0000 ug | Freq: Once | INTRAMUSCULAR | Status: AC
  Administered 2020-12-07: 100 ug via INTRAVENOUS
  Filled 2020-12-07: qty 2

## 2020-12-07 MED ORDER — ONDANSETRON 4 MG PO TBDP: 4.0000 mg | ORAL_TABLET | Freq: Four times a day (QID) | ORAL | Status: DC | PRN

## 2020-12-07 MED ORDER — LACTATED RINGERS IV BOLUS
1000.0000 mL | Freq: Once | INTRAVENOUS | Status: AC
  Administered 2020-12-07: 1000 mL via INTRAVENOUS

## 2020-12-07 MED ORDER — ONDANSETRON HCL 4 MG/2ML IJ SOLN
4.0000 mg | Freq: Four times a day (QID) | INTRAMUSCULAR | Status: DC | PRN
  Administered 2020-12-07: 4 mg via INTRAVENOUS
  Filled 2020-12-07: qty 2

## 2020-12-07 MED ORDER — HYDROMORPHONE HCL 1 MG/ML IJ SOLN
1.0000 mg | Freq: Once | INTRAMUSCULAR | Status: AC
Start: 2020-12-07 — End: 2020-12-07
  Administered 2020-12-07: 1 mg via INTRAVENOUS
  Filled 2020-12-07: qty 1

## 2020-12-07 MED ORDER — HYDROMORPHONE HCL 1 MG/ML IJ SOLN
1.0000 mg | Freq: Once | INTRAMUSCULAR | Status: AC
  Administered 2020-12-07: 1 mg via INTRAVENOUS
  Filled 2020-12-07: qty 1

## 2020-12-07 MED ORDER — ONDANSETRON HCL 4 MG/2ML IJ SOLN: 4.0000 mg | Freq: Four times a day (QID) | INTRAMUSCULAR | Status: DC | PRN

## 2020-12-07 MED ORDER — ONDANSETRON HCL 4 MG/2ML IJ SOLN
4.0000 mg | Freq: Once | INTRAMUSCULAR | Status: AC
  Administered 2020-12-07: 4 mg via INTRAVENOUS
  Filled 2020-12-07: qty 2

## 2020-12-07 MED ORDER — DEXTROSE IN LACTATED RINGERS 5 % IV SOLN: INTRAVENOUS | Status: DC

## 2020-12-07 MED ORDER — KETOROLAC TROMETHAMINE 30 MG/ML IJ SOLN
30.0000 mg | Freq: Four times a day (QID) | INTRAMUSCULAR | Status: DC
  Administered 2020-12-07 – 2020-12-09 (×8): 30 mg via INTRAVENOUS
  Filled 2020-12-07 (×9): qty 1

## 2020-12-07 NOTE — ED Triage Notes (Signed)
Pt c/o RUQ pain that started about 2am this morning.

## 2020-12-07 NOTE — ED Notes (Signed)
Pt arrived as transfer from [facility], pt states pain is 7/10 URQ, aox4, VSS

## 2020-12-07 NOTE — ED Provider Notes (Signed)
Patient transferred from [facility] for evaluation of abdominal pain.  She was found to have a large hiatal hernia and pancreas in the hiatal sac.  Dr. Cliffton Asters (cardiothoracic) was consulted and requested ED to ED transfer. Dr. Dalene Seltzer had consulted Dr. Cliffton Asters.  He will come down and evaluate the patient.  Patient states she is still having some pain.  She has some tenderness mostly in epigastric.  Will give additional pain medication.  Dr. Cliffton Asters will admit the patient.   Portions of this note were generated with Scientist, clinical (histocompatibility and immunogenetics). Dictation errors may occur despite best attempts at proofreading.     Maxwell Caul, PA-C 12/07/20 1200    Little, Ambrose Finland, MD 12/07/20 1304

## 2020-12-07 NOTE — ED Provider Notes (Signed)
[facility] EMERGENCY DEPARTMENT Provider Note   CSN: [id number] Arrival date & time: 12/07/20  0431     History Chief Complaint  Patient presents with  . Abdominal Pain    Tiffany Chambers is a 45 y.o. female.  45 year old female without significant past medical history who presents emerged from today with abdominal pain and vomiting.  Patient states that she was in her normal state of health prior to bed last night.  She states she had a little bit of flatulence but no significant pain or discomfort that was persistent.  She states that tonight around 0 100 she woke up with severe epigastric right upper quadrant abdominal pain.  It seems to radiate towards her back.  She had 1 episode of emesis at home that was blood-streaked.  She had 2 more episodes of emesis on the way here that she says were "bile".  On further questioning she is unsure if there is any blood noted that she states that she could not see it.  No known history with her gallbladder.  She is not a drinker.  She does not abuse drugs.  No history of any similar to this before.   Abdominal Pain      Past Medical History:  Diagnosis Date  . Arthritis    back, hands  . Depression   . GERD (gastroesophageal reflux disease)   . Headache    not since1/2021  . Reflux     Patient Active Problem List   Diagnosis Date Noted  . 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 Heel spur, right   . Morbid obesity (HCC) 07/12/2020  . Plantar fasciitis 07/12/2020  . Alopecia 10/10/2019  . Menorrhagia 10/10/2019  . GERD (gastroesophageal reflux disease) 08/27/2016  . Depression 08/27/2016    Past Surgical History:  Procedure Laterality Date  . CESAREAN SECTION     x 3  . HEEL SPUR RESECTION Right 08/14/2020   Procedure: POSSIBLE HEEL SPUR RESECTION;  Surgeon: Park Liter, DPM;  Location: [facility];  Service: Podiatry;  Laterality: Right;  . PLANTAR FASCIA RELEASE Right 08/14/2020   Procedure: ENDOSCOPIC PLANTAR  FASCIOTOMY;  Surgeon: Park Liter, DPM;  Location: [facility];  Service: Podiatry;  Laterality: Right;     OB History   No obstetric history on file.     Family History  Problem Relation Age of Onset  . Congestive Heart Failure Mother        NO MI, CAD, CABG per pt    Social History   Tobacco Use  . Smoking status: Former Smoker    Years: 15.00    Types: Cigarettes  . Smokeless tobacco: Never Used  . Tobacco comment: 1 pack lasts one month or more  Vaping Use  . Vaping Use: Never used  Substance Use Topics  . Alcohol use: Yes    Comment: social   . Drug use: No    Home Medications Prior to Admission medications   Medication Sig Start Date End Date Taking? Authorizing Provider  citalopram (CELEXA) 20 MG tablet Take 20 mg by mouth daily. 05/20/13   [provider]  diphenhydramine-acetaminophen (TYLENOL PM) 25-500 MG TABS tablet Take 2 tablets by mouth at bedtime.    [provider]  esomeprazole (NEXIUM) 20 MG capsule Take 20 mg by mouth daily.    [provider]  GIANVI 3-0.02 MG tablet Take 1 tablet by mouth daily. 04/06/13   [provider]  Multiple Vitamin (MULTIVITAMIN) capsule Take 1 capsule  by mouth daily.    [provider]  neomycin-bacitracin-polymyxin (NEOSPORIN) ointment Apply 1 application topically as needed for wound care.    [provider]  ondansetron (ZOFRAN) 4 MG tablet Take 1 tablet (4 mg total) by mouth every 8 (eight) hours as needed for nausea or vomiting. 07/18/20   Park Liter, DPM  vitamin E 180 MG (400 UNITS) capsule Take 1,600 Units by mouth daily.    [provider]    Allergies    Morphine and related  Review of Systems   Review of Systems  Gastrointestinal: Positive for abdominal pain.  All other systems reviewed and are negative.   Physical Exam Updated Vital Signs BP 136/87   Pulse 72   Temp 98.3 F (36.8 C) (Oral)   Resp 18   Ht 5\' 4"  (1.626 m)   Wt 136.1  kg   SpO2 99%   BMI 51.49 kg/m   Physical Exam Vitals and nursing note reviewed.  Constitutional:      Appearance: She is well-developed. She is diaphoretic.  HENT:     Head: Normocephalic and atraumatic.     Mouth/Throat:     Mouth: Mucous membranes are moist.     Pharynx: Oropharynx is clear.  Cardiovascular:     Rate and Rhythm: Regular rhythm. Tachycardia present.  Pulmonary:     Effort: No respiratory distress.     Breath sounds: No stridor.  Abdominal:     General: There is no distension.     Tenderness: There is abdominal tenderness in the right upper quadrant and epigastric area.  Musculoskeletal:     Cervical back: Normal range of motion.  Skin:    General: Skin is warm.     Coloration: Skin is not cyanotic.  Neurological:     Mental Status: She is alert.     ED Results / Procedures / Treatments   Labs (all labs ordered are listed, but only abnormal results are displayed) Labs Reviewed  COMPREHENSIVE METABOLIC PANEL - Abnormal; Notable for the following components:      Result Value   CO2 20 (*)    Glucose, Bld 116 (*)    All other components within normal limits  RESP PANEL BY RT-PCR (FLU A&B, COVID) ARPGX2  CBC WITH DIFFERENTIAL/PLATELET  LIPASE, BLOOD  HCG, SERUM, QUALITATIVE  URINALYSIS, ROUTINE W REFLEX MICROSCOPIC    EKG None  Radiology CT ABDOMEN PELVIS WO CONTRAST  Result Date: 12/07/2020 CLINICAL DATA:  Abdominal abscess expected. RIGHT upper quadrant pain. Nausea vomiting. EXAM: CT ABDOMEN AND PELVIS WITHOUT CONTRAST TECHNIQUE: Multidetector CT imaging of the abdomen and pelvis was performed following the standard protocol without IV contrast. COMPARISON:  None. FINDINGS: Lower chest: Lung bases are clear. Hepatobiliary: No focal hepatic lesion. No biliary duct dilatation. Common bile duct is normal. Pancreas: Body and tail the pancreas extend to enlarge hiatal hernia sac posterior to the heart. No evidence of acute inflammation. Spleen: Normal  spleen Adrenals/urinary tract: Adrenal glands and kidneys are normal. The ureters and bladder normal. Stomach/Bowel: Near entirety of the stomach resides in hernia sac posterior to the heart. There is some folding of the stomach however no volvulus evident. The body and tail of the pancreas also reside within the hernia sac. The pyloric region of stomach is below the hemidiaphragm. The duodenum small bowel normal. Appendix normal. The colon and rectosigmoid colon are normal. Vascular/Lymphatic: Abdominal aorta is normal caliber. No periportal or retroperitoneal adenopathy. No pelvic adenopathy. Reproductive: Post hysterectomy.  Adnexa  unremarkable Other: No free fluid. Musculoskeletal: No aggressive osseous lesion. IMPRESSION: 1. Large hiatal hernia with the near entirety of the stomach in the middle mediastinum posterior to the heart. The pancreas also resides in the large hernia sac. This presumably could be a source of pain. 2. No evidence of bowel obstruction. 3. Normal appendix and gallbladder. Electronically Signed   By: Genevive Bi M.D.   On: 12/07/2020 07:13    Procedures Procedures CRITICAL CARE Performed by: Marily Memos Total critical care time: 35 minutes Critical care time was exclusive of separately billable procedures and treating other patients. Critical care was necessary to treat or prevent imminent or life-threatening deterioration. Critical care was time spent personally by me on the following activities: development of treatment plan with patient and/or surrogate as well as nursing, discussions with consultants, evaluation of patient's response to treatment, examination of patient, obtaining history from patient or surrogate, ordering and performing treatments and interventions, ordering and review of laboratory studies, ordering and review of radiographic studies, pulse oximetry and re-evaluation of patient's condition.   Medications Ordered in ED Medications  HYDROmorphone  (DILAUDID) injection 1 mg (has no administration in time range)  ondansetron (ZOFRAN) injection 4 mg (has no administration in time range)  fentaNYL (SUBLIMAZE) injection 100 mcg (100 mcg Intravenous Given 12/07/20 0533)  ondansetron (ZOFRAN) injection 4 mg (4 mg Intravenous Given 12/07/20 0533)  lactated ringers bolus 1,000 mL (0 mLs Intravenous Stopped 12/07/20 0725)  HYDROmorphone (DILAUDID) injection 1 mg (1 mg Intravenous Given 12/07/20 0604)  HYDROmorphone (DILAUDID) injection 1 mg (1 mg Intravenous Given 12/07/20 0731)    ED Course  I have reviewed the triage vital signs and the nursing notes.  Pertinent labs & imaging results that were available during my care of the patient were reviewed by me and considered in my medical decision making (see chart for details).    MDM Rules/Calculators/A&P                         GB vs pancreatitis.   Ultimately found to have large hiatal hernia and pancreas in the hiatal sac as well. Discussed with Dr. Henreitta Leber, too complicated for here.   Discussed with Dr. Cliffton Asters, request transfer to [place] to ED and call upon arrival for surgery.   discussed with Dr. Dalene Seltzer, accepts to ED. Patient and family updated.   Final Clinical Impression(s) / ED Diagnoses Final diagnoses:  Hiatal hernia    Rx / DC Orders ED Discharge Orders    None       Dulcie Gammon, Barbara Cower, MD 12/07/20 [phone]

## 2020-12-07 NOTE — ED Notes (Signed)
Attempted report to [facility] ED 4 times with no answer.

## 2020-12-07 NOTE — H&P (Signed)
[address]       [place] [address]             [phone]                    Tiffany Chambers [facility] Medical Record #[id number] Date of Birth: January 23, 1976  Referring: No ref. provider found Primary Care: Donita Brooks, MD Primary Cardiologist: None  Chief Complaint:    Chief Complaint  Patient presents with  . Abdominal Pain    History of Present Illness:    Tiffany Chambers 45 y.o. female transferred from an hospital with a diagnosis of large paraesophageal hiatal hernia.  Last night she was awoken from sleep with acute onset epigastric and right upper quadrant pain.  This was followed by 3 episodes of emesis.  The first episode there was some bloody streaks, but the last 2 consisted mostly of bile.  She continues to have some epigastric pain but this is improved pain medication.  She has received antibiotics since being in the hospital.  She denies any nausea currently.    Past Medical History:  Diagnosis Date  . Arthritis    back, hands  . Depression   . GERD (gastroesophageal reflux disease)   . Headache    not since1/2021  . Reflux     Past Surgical History:  Procedure Laterality Date  . CESAREAN SECTION     x 3  . HEEL SPUR RESECTION Right 08/14/2020   Procedure: POSSIBLE HEEL SPUR RESECTION;  Surgeon: Park Liter, DPM;  Location: [facility];  Service: Podiatry;  Laterality: Right;  . PLANTAR FASCIA RELEASE Right 08/14/2020   Procedure: ENDOSCOPIC PLANTAR FASCIOTOMY;  Surgeon: Park Liter, DPM;  Location: [facility];  Service: Podiatry;  Laterality: Right;    Family History  Problem Relation Age of Onset  . Congestive Heart Failure Mother        NO MI, CAD, CABG per pt     Social History   Tobacco Use  Smoking Status Former Smoker  . Years: 15.00  . Types: Cigarettes  Smokeless Tobacco Never Used  Tobacco Comment   1 pack lasts one month or more    Social History   Substance and Sexual Activity  Alcohol Use  Yes   Comment: social      Allergies  Allergen Reactions  . Morphine And Related Itching    Current Facility-Administered Medications  Medication Dose Route Frequency Provider Last Rate Last Admin  . dextrose 5 % in lactated ringers infusion   Intravenous Continuous Desirai Traxler O, MD      . enoxaparin (LOVENOX) injection 40 mg  40 mg Subcutaneous Q24H Kyrene Longan O, MD      . HYDROmorphone (DILAUDID) injection 0.5 mg  0.5 mg Intravenous Once Graciella Freer A, PA-C      . HYDROmorphone (DILAUDID) injection 1 mg  1 mg Intravenous Q1H PRN Mesner, Barbara Cower, MD   1 mg at 12/07/20 1111  . HYDROmorphone (DILAUDID) injection 1 mg  1 mg Intravenous Once Alvira Monday, MD      . HYDROmorphone (DILAUDID) injection 1 mg  1 mg Intravenous Q2H PRN Kenneth Cuaresma O, MD      . ketorolac (TORADOL) 30 MG/ML injection 30 mg  30 mg Intravenous Q6H Mertis Mosher O, MD      . ondansetron (ZOFRAN) injection 4 mg  4 mg Intravenous Q6H PRN Mesner, Barbara Cower, MD   4 mg at  12/07/20 0908  . ondansetron (ZOFRAN-ODT) disintegrating tablet 4 mg  4 mg Oral Q6H PRN Koty Anctil O, MD       Or  . ondansetron (ZOFRAN) injection 4 mg  4 mg Intravenous Q6H PRN Brianna Esson, Eliezer Lofts, MD       Current Outpatient Medications  Medication Sig Dispense Refill  . citalopram (CELEXA) 20 MG tablet Take 20 mg by mouth daily.    . diphenhydramine-acetaminophen (TYLENOL PM) 25-500 MG TABS tablet Take 2 tablets by mouth at bedtime.    Marland Kitchen esomeprazole (NEXIUM) 20 MG capsule Take 20 mg by mouth daily.    Marland Kitchen GIANVI 3-0.02 MG tablet Take 1 tablet by mouth daily.    . Multiple Vitamin (MULTIVITAMIN) capsule Take 1 capsule by mouth daily.    Marland Kitchen neomycin-bacitracin-polymyxin (NEOSPORIN) ointment Apply 1 application topically as needed for wound care.    . ondansetron (ZOFRAN) 4 MG tablet Take 1 tablet (4 mg total) by mouth every 8 (eight) hours as needed for nausea or vomiting. 20 tablet 0  . vitamin E 180 MG (400  UNITS) capsule Take 1,600 Units by mouth daily.      Review of Systems  Constitutional: Negative.   HENT: Negative.   Cardiovascular: Negative.   Gastrointestinal: Positive for abdominal pain, heartburn, nausea and vomiting.  Neurological: Negative.     PHYSICAL EXAMINATION: BP (!) 143/82 (BP Location: Left Arm)   Pulse 72   Temp 98.3 F (36.8 C) (Oral)   Resp 14   Ht 5\' 4"  (1.626 m)   Wt 136.1 kg   SpO2 100%   BMI 51.49 kg/m   Physical Exam Constitutional:      General: She is not in acute distress.    Appearance: She is well-developed. She is obese. She is not ill-appearing, toxic-appearing or diaphoretic.  HENT:     Head: Normocephalic and atraumatic.  Cardiovascular:     Rate and Rhythm: Normal rate.  Pulmonary:     Effort: Pulmonary effort is normal. No respiratory distress.  Abdominal:     Palpations: Abdomen is soft.  Skin:    General: Skin is warm and dry.  Neurological:     General: No focal deficit present.     Mental Status: She is alert and oriented to person, place, and time.      Diagnostic Studies & Laboratory data:     Recent Radiology Findings:   CT ABDOMEN PELVIS WO CONTRAST  Result Date: 12/07/2020 CLINICAL DATA:  Abdominal abscess expected. RIGHT upper quadrant pain. Nausea vomiting. EXAM: CT ABDOMEN AND PELVIS WITHOUT CONTRAST TECHNIQUE: Multidetector CT imaging of the abdomen and pelvis was performed following the standard protocol without IV contrast. COMPARISON:  None. FINDINGS: Lower chest: Lung bases are clear. Hepatobiliary: No focal hepatic lesion. No biliary duct dilatation. Common bile duct is normal. Pancreas: Body and tail the pancreas extend to enlarge hiatal hernia sac posterior to the heart. No evidence of acute inflammation. Spleen: Normal spleen Adrenals/urinary tract: Adrenal glands and kidneys are normal. The ureters and bladder normal. Stomach/Bowel: Near entirety of the stomach resides in hernia sac posterior to the heart. There  is some folding of the stomach however no volvulus evident. The body and tail of the pancreas also reside within the hernia sac. The pyloric region of stomach is below the hemidiaphragm. The duodenum small bowel normal. Appendix normal. The colon and rectosigmoid colon are normal. Vascular/Lymphatic: Abdominal aorta is normal caliber. No periportal or retroperitoneal adenopathy. No pelvic adenopathy. Reproductive: Post hysterectomy.  Adnexa unremarkable Other: No free fluid. Musculoskeletal: No aggressive osseous lesion. IMPRESSION: 1. Large hiatal hernia with the near entirety of the stomach in the middle mediastinum posterior to the heart. The pancreas also resides in the large hernia sac. This presumably could be a source of pain. 2. No evidence of bowel obstruction. 3. Normal appendix and gallbladder. Electronically Signed   By: Genevive Bi M.D.   On: 12/07/2020 07:13       I have independently reviewed the above radiology studies  and reviewed the findings with the patient.   Recent Lab Findings: Lab Results  Component Value Date   WBC 9.8 12/07/2020   HGB 12.3 12/07/2020   HCT 36.6 12/07/2020   PLT 396 12/07/2020   GLUCOSE 116 (H) 12/07/2020   CHOL 226 (H) 08/27/2016   TRIG 215 (H) 08/27/2016   HDL 83 08/27/2016   LDLCALC 100 (H) 08/27/2016   ALT 12 12/07/2020   AST 18 12/07/2020   NA 136 12/07/2020   K 4.1 12/07/2020   CL 105 12/07/2020   CREATININE 0.74 12/07/2020   BUN 13 12/07/2020   CO2 20 (L) 12/07/2020   TSH 1.71 04/28/2018      Assessment / Plan:   45 year old female with a large paraesophageal hernia.  She also is morbidly obese with a BMI of 51.  I reviewed her images she does have a very large almost all of her stomach and the sac.  There is no evidence of volvulus.  Part of the body and tail of the pancreas also resides within the hernia sac.  She will be admitted and placed on IV fluids If her pain remains uncontrolled with IV medications and urgent robotic  paraesophageal hernia later today or tomorrow.  Given her body habitus mesh for this repair.      Corliss Skains 12/07/2020 11:55 AM

## 2020-12-08 DIAGNOSIS — R519 Headache, unspecified: Secondary | ICD-10-CM

## 2020-12-08 DIAGNOSIS — K449 Diaphragmatic hernia without obstruction or gangrene: Secondary | ICD-10-CM

## 2020-12-08 LAB — SURGICAL PCR SCREEN
MRSA, PCR: NEGATIVE
Staphylococcus aureus: NEGATIVE

## 2020-12-08 LAB — BASIC METABOLIC PANEL
Anion gap: 8 (ref 5–15)
BUN: 8 mg/dL (ref 6–20)
CO2: 24 mmol/L (ref 22–32)
Calcium: 8.5 mg/dL — ABNORMAL LOW (ref 8.9–10.3)
Chloride: 103 mmol/L (ref 98–111)
Creatinine, Ser: 0.76 mg/dL (ref 0.44–1.00)
GFR, Estimated: >60 mL/min (ref >60)
Glucose, Bld: 95 mg/dL (ref 70–99)
Potassium: 3.8 mmol/L (ref 3.5–5.1)
Sodium: 135 mmol/L (ref 135–145)

## 2020-12-08 LAB — CBC
HCT: 33.4 % — ABNORMAL LOW (ref 36.0–46.0)
Hemoglobin: 10.9 g/dL — ABNORMAL LOW (ref 12.0–15.0)
MCH: 30.2 pg (ref 26.0–34.0)
MCHC: 32.6 g/dL (ref 30.0–36.0)
MCV: 92.5 fL (ref 80.0–100.0)
Platelets: 277 10*3/uL (ref 150–400)
RBC: 3.61 MIL/uL — ABNORMAL LOW (ref 3.87–5.11)
RDW: 12.6 % (ref 11.5–15.5)
WBC: 9.7 10*3/uL (ref 4.0–10.5)
nRBC: 0 % (ref 0.0–0.2)

## 2020-12-08 LAB — URINALYSIS, ROUTINE W REFLEX MICROSCOPIC
Bilirubin Urine: NEGATIVE
Glucose, UA: NEGATIVE mg/dL
Hgb urine dipstick: NEGATIVE
Ketones, ur: NEGATIVE mg/dL
Leukocytes,Ua: NEGATIVE
Nitrite: NEGATIVE
Protein, ur: NEGATIVE mg/dL
Specific Gravity, Urine: 1.016 (ref 1.005–1.030)
pH: 5 (ref 5.0–8.0)

## 2020-12-08 MED ORDER — ACETAMINOPHEN 325 MG PO TABS
650.0000 mg | ORAL_TABLET | ORAL | Status: DC | PRN
  Administered 2020-12-08 (×3): 650 mg via ORAL
  Filled 2020-12-08 (×3): qty 2

## 2020-12-08 NOTE — Progress Notes (Addendum)
      [address]       [name] [address]             [phone]      Subjective:  Patient complains of headache.  She denies chest, abdominal pain.    Objective: Vital signs in last 24 hours: Temp:  [97.9 F (36.6 C)-98.9 F (37.2 C)] 97.9 F (36.6 C) (06/05 0829) Pulse Rate:  [70-91] 84 (06/05 0829) Cardiac Rhythm: Normal sinus rhythm (06/05 0700) Resp:  [11-20] 15 (06/05 0829) BP: (116-160)/(57-96) 160/90 (06/05 0829) SpO2:  [94 %-100 %] 100 % (06/05 0829)  Intake/Output from previous day: 06/04 0701 - 06/05 0700 In: 2258.1 [I.V.:1268.1; IV Piggyback:990] Out: -   General appearance: alert, cooperative and no distress Heart: regular rate and rhythm Lungs: diminshed on left Abdomen: soft, non-tender; bowel sounds normal; no masses,  no organomegaly  Lab Results: Recent Labs    12/07/20 1148 12/08/20 0052  WBC 13.2* 9.7  HGB 11.8* 10.9*  HCT 35.4* 33.4*  PLT 325 277   BMET:  Recent Labs    12/07/20 0514 12/07/20 1148 12/08/20 0052  NA 136  --  135  K 4.1  --  3.8  CL 105  --  103  CO2 20*  --  24  GLUCOSE 116*  --  95  BUN 13  --  8  CREATININE 0.74 0.74 0.76  CALCIUM 9.4  --  8.5*    PT/INR: No results for input(s): LABPROT, INR in the last 72 hours. ABG No results found for: PHART, HCO3, TCO2, ACIDBASEDEF, O2SAT CBG (last 3)  No results for input(s): GLUCAP in the last 72 hours.  Assessment/Plan:  1. Large Hiatal Hernia- currently pain free, currently NPO.Marland Kitchen if not going to surgery today, will see if she can have liquid diet for the day 2. Headache- tylenol prn 3. Dispo- patient stable, currently without chest/abdominal discomfort, for hernia repair tomorrow afternoon if remains stable, will discuss diet with Dr. Cliffton Asters   LOS: 1 day    Lowella Dandy, PA-C 12/08/2020  Agree with above OR tomorrow Ok for clears  Corliss Skains

## 2020-12-08 NOTE — Plan of Care (Signed)
  Problem: Clinical Measurements: Goal: Respiratory complications will improve Outcome: Progressing Goal: Cardiovascular complication will be avoided Outcome: Progressing   Problem: Nutrition: Goal: Adequate nutrition will be maintained Outcome: Progressing   

## 2020-12-09 ENCOUNTER — Inpatient Hospital Stay (HOSPITAL_COMMUNITY): Payer: Managed Care, Other (non HMO)

## 2020-12-09 ENCOUNTER — Encounter (HOSPITAL_COMMUNITY)
Admission: EM | Disposition: A | Discharge status: Home / Self Care | Payer: Self-pay | Source: Home / Self Care | Attending: Thoracic Surgery (Cardiothoracic Vascular Surgery)

## 2020-12-09 ENCOUNTER — Inpatient Hospital Stay (HOSPITAL_COMMUNITY): Payer: Managed Care, Other (non HMO) | Admitting: Anesthesiology

## 2020-12-09 DIAGNOSIS — Z9889 Other specified postprocedural states: Secondary | ICD-10-CM

## 2020-12-09 DIAGNOSIS — K449 Diaphragmatic hernia without obstruction or gangrene: Secondary | ICD-10-CM

## 2020-12-09 DIAGNOSIS — Z6841 Body Mass Index (BMI) 40.0 and over, adult: Secondary | ICD-10-CM

## 2020-12-09 DIAGNOSIS — K219 Gastro-esophageal reflux disease without esophagitis: Secondary | ICD-10-CM

## 2020-12-09 HISTORY — PX: XI ROBOTIC ASSISTED HIATAL HERNIA REPAIR: SHX6889

## 2020-12-09 HISTORY — PX: ESOPHAGOGASTRODUODENOSCOPY: SHX5428

## 2020-12-09 LAB — CBC
HCT: 36.5 % (ref 36.0–46.0)
Hemoglobin: 12 g/dL (ref 12.0–15.0)
MCH: 30.2 pg (ref 26.0–34.0)
MCHC: 32.9 g/dL (ref 30.0–36.0)
MCV: 91.7 fL (ref 80.0–100.0)
Platelets: 351 10*3/uL (ref 150–400)
RBC: 3.98 MIL/uL (ref 3.87–5.11)
RDW: 12.4 % (ref 11.5–15.5)
WBC: 14.5 10*3/uL — ABNORMAL HIGH (ref 4.0–10.5)
nRBC: 0 % (ref 0.0–0.2)

## 2020-12-09 LAB — CREATININE, SERUM
Creatinine, Ser: 0.72 mg/dL (ref 0.44–1.00)
GFR, Estimated: >60 mL/min (ref >60)

## 2020-12-09 SURGERY — REPAIR, HERNIA, HIATAL, ROBOT-ASSISTED
Anesthesia: General | Site: Throat

## 2020-12-09 MED ORDER — ONDANSETRON HCL 4 MG/2ML IJ SOLN
4.0000 mg | Freq: Four times a day (QID) | INTRAMUSCULAR | Status: DC
  Administered 2020-12-09 – 2020-12-10 (×3): 4 mg via INTRAVENOUS
  Filled 2020-12-09 (×3): qty 2

## 2020-12-09 MED ORDER — LIDOCAINE 2% (20 MG/ML) 5 ML SYRINGE
INTRAMUSCULAR | Status: AC
  Filled 2020-12-09: qty 5

## 2020-12-09 MED ORDER — PHENYLEPHRINE 40 MCG/ML (10ML) SYRINGE FOR IV PUSH (FOR BLOOD PRESSURE SUPPORT)
PREFILLED_SYRINGE | INTRAVENOUS | Status: AC
  Filled 2020-12-09: qty 10

## 2020-12-09 MED ORDER — ENOXAPARIN SODIUM 40 MG/0.4ML IJ SOSY
40.0000 mg | PREFILLED_SYRINGE | INTRAMUSCULAR | Status: DC
  Administered 2020-12-10 – 2020-12-11 (×2): 40 mg via SUBCUTANEOUS
  Filled 2020-12-09 (×2): qty 0.4

## 2020-12-09 MED ORDER — HYDROMORPHONE HCL 1 MG/ML IJ SOLN
INTRAMUSCULAR | Status: AC
  Administered 2020-12-09: 0.5 mg via INTRAVENOUS
  Filled 2020-12-09: qty 1

## 2020-12-09 MED ORDER — CHLORHEXIDINE GLUCONATE 0.12 % MT SOLN
OROMUCOSAL | Status: AC
  Administered 2020-12-09: 15 mL via OROMUCOSAL
  Filled 2020-12-09: qty 15

## 2020-12-09 MED ORDER — ACETAMINOPHEN 10 MG/ML IV SOLN
1000.0000 mg | Freq: Once | INTRAVENOUS | Status: AC
  Administered 2020-12-09: 1000 mg via INTRAVENOUS

## 2020-12-09 MED ORDER — CEFAZOLIN IN SODIUM CHLORIDE 3-0.9 GM/100ML-% IV SOLN
3.0000 g | Freq: Once | INTRAVENOUS | Status: AC
  Administered 2020-12-09: 3 g via INTRAVENOUS
  Filled 2020-12-09: qty 100

## 2020-12-09 MED ORDER — DEXAMETHASONE SODIUM PHOSPHATE 10 MG/ML IJ SOLN
INTRAMUSCULAR | Status: DC | PRN
  Administered 2020-12-09: 10 mg via INTRAVENOUS

## 2020-12-09 MED ORDER — CHLORHEXIDINE GLUCONATE 0.12 % MT SOLN: 15.0000 mL | Freq: Once | OROMUCOSAL | Status: AC

## 2020-12-09 MED ORDER — ARTIFICIAL TEARS OPHTHALMIC OINT
TOPICAL_OINTMENT | OPHTHALMIC | Status: AC
  Filled 2020-12-09: qty 3.5

## 2020-12-09 MED ORDER — CEFAZOLIN SODIUM-DEXTROSE 2-4 GM/100ML-% IV SOLN
2.0000 g | Freq: Three times a day (TID) | INTRAVENOUS | Status: AC
  Administered 2020-12-09: 2 g via INTRAVENOUS
  Filled 2020-12-09: qty 100

## 2020-12-09 MED ORDER — LACTATED RINGERS IV SOLN: INTRAVENOUS | Status: DC | PRN

## 2020-12-09 MED ORDER — ACETAMINOPHEN 10 MG/ML IV SOLN
INTRAVENOUS | Status: AC
  Filled 2020-12-09: qty 100

## 2020-12-09 MED ORDER — 0.9 % SODIUM CHLORIDE (POUR BTL) OPTIME
TOPICAL | Status: DC | PRN
  Administered 2020-12-09: 2000 mL

## 2020-12-09 MED ORDER — FENTANYL CITRATE (PF) 250 MCG/5ML IJ SOLN
INTRAMUSCULAR | Status: DC | PRN
  Administered 2020-12-09 (×2): 25 ug via INTRAVENOUS
  Administered 2020-12-09: 50 ug via INTRAVENOUS
  Administered 2020-12-09: 100 ug via INTRAVENOUS

## 2020-12-09 MED ORDER — KETOROLAC TROMETHAMINE 30 MG/ML IJ SOLN
30.0000 mg | Freq: Four times a day (QID) | INTRAMUSCULAR | Status: DC
  Administered 2020-12-09 – 2020-12-11 (×7): 30 mg via INTRAVENOUS
  Filled 2020-12-09 (×7): qty 1

## 2020-12-09 MED ORDER — ROCURONIUM BROMIDE 10 MG/ML (PF) SYRINGE
PREFILLED_SYRINGE | INTRAVENOUS | Status: AC
  Filled 2020-12-09: qty 30

## 2020-12-09 MED ORDER — HYDROMORPHONE HCL 1 MG/ML IJ SOLN
0.2500 mg | INTRAMUSCULAR | Status: DC | PRN
Start: 2020-12-09 — End: 2020-12-09
  Administered 2020-12-09 (×2): 0.5 mg via INTRAVENOUS

## 2020-12-09 MED ORDER — PROPOFOL 10 MG/ML IV BOLUS
INTRAVENOUS | Status: AC
  Filled 2020-12-09: qty 20

## 2020-12-09 MED ORDER — EPHEDRINE 5 MG/ML INJ
INTRAVENOUS | Status: AC
  Filled 2020-12-09: qty 10

## 2020-12-09 MED ORDER — BUPIVACAINE LIPOSOME 1.3 % IJ SUSP
INTRAMUSCULAR | Status: AC
  Filled 2020-12-09: qty 20

## 2020-12-09 MED ORDER — ONDANSETRON HCL 4 MG/2ML IJ SOLN
INTRAMUSCULAR | Status: DC | PRN
  Administered 2020-12-09: 4 mg via INTRAVENOUS

## 2020-12-09 MED ORDER — LACTATED RINGERS IV SOLN: INTRAVENOUS | Status: DC

## 2020-12-09 MED ORDER — FENTANYL CITRATE (PF) 100 MCG/2ML IJ SOLN
25.0000 ug | INTRAMUSCULAR | Status: DC | PRN
  Administered 2020-12-09 – 2020-12-10 (×3): 25 ug via INTRAVENOUS
  Filled 2020-12-09 (×3): qty 2

## 2020-12-09 MED ORDER — KETOROLAC TROMETHAMINE 30 MG/ML IJ SOLN
INTRAMUSCULAR | Status: AC
  Administered 2020-12-09: 30 mg via INTRAVENOUS
  Filled 2020-12-09: qty 1

## 2020-12-09 MED ORDER — MIDAZOLAM HCL 2 MG/2ML IJ SOLN
INTRAMUSCULAR | Status: DC | PRN
  Administered 2020-12-09: 2 mg via INTRAVENOUS

## 2020-12-09 MED ORDER — DEXAMETHASONE SODIUM PHOSPHATE 10 MG/ML IJ SOLN
INTRAMUSCULAR | Status: AC
  Filled 2020-12-09: qty 2

## 2020-12-09 MED ORDER — PHENYLEPHRINE HCL-NACL 10-0.9 MG/250ML-% IV SOLN
INTRAVENOUS | Status: DC | PRN
  Administered 2020-12-09: 20 ug/min via INTRAVENOUS
  Administered 2020-12-09: 40 ug/min via INTRAVENOUS

## 2020-12-09 MED ORDER — GLYCOPYRROLATE PF 0.2 MG/ML IJ SOSY
PREFILLED_SYRINGE | INTRAMUSCULAR | Status: AC
  Filled 2020-12-09: qty 1

## 2020-12-09 MED ORDER — PHENYLEPHRINE 40 MCG/ML (10ML) SYRINGE FOR IV PUSH (FOR BLOOD PRESSURE SUPPORT)
PREFILLED_SYRINGE | INTRAVENOUS | Status: DC | PRN
  Administered 2020-12-09: 120 ug via INTRAVENOUS

## 2020-12-09 MED ORDER — LIDOCAINE 2% (20 MG/ML) 5 ML SYRINGE
INTRAMUSCULAR | Status: DC | PRN
  Administered 2020-12-09: 60 mg via INTRAVENOUS

## 2020-12-09 MED ORDER — FENTANYL CITRATE (PF) 250 MCG/5ML IJ SOLN
INTRAMUSCULAR | Status: AC
  Filled 2020-12-09: qty 5

## 2020-12-09 MED ORDER — PROPOFOL 10 MG/ML IV BOLUS
INTRAVENOUS | Status: DC | PRN
  Administered 2020-12-09: 200 mg via INTRAVENOUS

## 2020-12-09 MED ORDER — ROCURONIUM BROMIDE 10 MG/ML (PF) SYRINGE
PREFILLED_SYRINGE | INTRAVENOUS | Status: DC | PRN
  Administered 2020-12-09: 20 mg via INTRAVENOUS
  Administered 2020-12-09: 60 mg via INTRAVENOUS
  Administered 2020-12-09: 20 mg via INTRAVENOUS
  Administered 2020-12-09: 10 mg via INTRAVENOUS
  Administered 2020-12-09 (×2): 20 mg via INTRAVENOUS

## 2020-12-09 MED ORDER — EPHEDRINE SULFATE-NACL 50-0.9 MG/10ML-% IV SOSY
PREFILLED_SYRINGE | INTRAVENOUS | Status: DC | PRN
  Administered 2020-12-09: 10 mg via INTRAVENOUS

## 2020-12-09 MED ORDER — ONDANSETRON HCL 4 MG/2ML IJ SOLN
INTRAMUSCULAR | Status: AC
  Filled 2020-12-09: qty 6

## 2020-12-09 MED ORDER — MIDAZOLAM HCL 2 MG/2ML IJ SOLN
INTRAMUSCULAR | Status: AC
  Filled 2020-12-09: qty 2

## 2020-12-09 MED ORDER — SUGAMMADEX SODIUM 200 MG/2ML IV SOLN
INTRAVENOUS | Status: DC | PRN
  Administered 2020-12-09: 300 mg via INTRAVENOUS

## 2020-12-09 MED ORDER — ACETAMINOPHEN 500 MG PO TABS
1000.0000 mg | ORAL_TABLET | Freq: Four times a day (QID) | ORAL | Status: DC
  Administered 2020-12-09 – 2020-12-10 (×2): 1000 mg via ORAL
  Filled 2020-12-09 (×4): qty 2

## 2020-12-09 MED ORDER — BUPIVACAINE HCL (PF) 0.5 % IJ SOLN
INTRAMUSCULAR | Status: AC
  Filled 2020-12-09: qty 30

## 2020-12-09 MED ORDER — BUPIVACAINE LIPOSOME 1.3 % IJ SUSP
INTRAMUSCULAR | Status: DC | PRN
  Administered 2020-12-09: 50 mL

## 2020-12-09 MED ORDER — ORAL CARE MOUTH RINSE: 15.0000 mL | Freq: Once | OROMUCOSAL | Status: AC

## 2020-12-09 SURGICAL SUPPLY — 78 items
ADH SKN CLS APL DERMABOND .7 (GAUZE/BANDAGES/DRESSINGS) ×2
BLADE SURG 11 STRL SS (BLADE) ×3 IMPLANT
CANISTER SUCT 3000ML PPV (MISCELLANEOUS) ×6 IMPLANT
CANNULA REDUC XI 12-8 STAPL (CANNULA)
CANNULA REDUCER 12-8 DVNC XI (CANNULA) IMPLANT
CATH THORACIC 28FR (CATHETERS) ×3 IMPLANT
DEFOGGER SCOPE WARMER CLEARIFY (MISCELLANEOUS) ×3 IMPLANT
DERMABOND ADVANCED (GAUZE/BANDAGES/DRESSINGS) ×1
DERMABOND ADVANCED .7 DNX12 (GAUZE/BANDAGES/DRESSINGS) ×4 IMPLANT
DEVICE SUTURE ENDOST 10MM (ENDOMECHANICALS) IMPLANT
DRAIN PENROSE 1/4X12 LTX STRL (WOUND CARE) ×2 IMPLANT
DRAPE COLUMN DVNC XI (DISPOSABLE) ×8 IMPLANT
DRAPE CV SPLIT W-CLR ANES SCRN (DRAPES) ×3 IMPLANT
DRAPE DA VINCI XI COLUMN (DISPOSABLE) ×12
DRAPE INCISE IOBAN 66X45 STRL (DRAPES) ×1 IMPLANT
DRAPE ORTHO SPLIT 77X108 STRL (DRAPES) ×3
DRAPE SURG ORHT 6 SPLT 77X108 (DRAPES) ×2 IMPLANT
ELECT REM PT RETURN 9FT ADLT (ELECTROSURGICAL) ×3
ELECTRODE REM PT RTRN 9FT ADLT (ELECTROSURGICAL) ×2 IMPLANT
FELT TEFLON 1X6 (MISCELLANEOUS) IMPLANT
GAUZE SPONGE 4X4 12PLY STRL (GAUZE/BANDAGES/DRESSINGS) IMPLANT
GLOVE BIO SURGEON STRL SZ7.5 (GLOVE) ×3 IMPLANT
GOWN STRL REUS W/ TWL LRG LVL3 (GOWN DISPOSABLE) ×2 IMPLANT
GOWN STRL REUS W/ TWL XL LVL3 (GOWN DISPOSABLE) ×4 IMPLANT
GOWN STRL REUS W/TWL 2XL LVL3 (GOWN DISPOSABLE) ×3 IMPLANT
GOWN STRL REUS W/TWL LRG LVL3 (GOWN DISPOSABLE) ×6
GOWN STRL REUS W/TWL XL LVL3 (GOWN DISPOSABLE) ×6
GRAFT MYRIAD 5 LAYER 7X10 (Graft) ×1 IMPLANT
GRASPER SUT TROCAR 14GX15 (MISCELLANEOUS) ×1 IMPLANT
IRRIGATION STRYKERFLOW (MISCELLANEOUS) ×2 IMPLANT
IRRIGATOR STRYKERFLOW (MISCELLANEOUS) ×3
IV NS 1000ML (IV SOLUTION)
IV NS 1000ML BAXH (IV SOLUTION) IMPLANT
KIT BASIN OR (CUSTOM PROCEDURE TRAY) ×3 IMPLANT
KIT TURNOVER KIT B (KITS) ×3 IMPLANT
NDL 18GX1X1/2 (RX/OR ONLY) (NEEDLE) IMPLANT
NEEDLE 18GX1X1/2 (RX/OR ONLY) (NEEDLE) IMPLANT
NS IRRIG 1000ML POUR BTL (IV SOLUTION) ×6 IMPLANT
OBTURATOR OPTICAL STANDARD 8MM (TROCAR) ×3
OBTURATOR OPTICAL STND 8 DVNC (TROCAR) ×2
OBTURATOR OPTICALSTD 8 DVNC (TROCAR) ×2 IMPLANT
PACK CHEST (CUSTOM PROCEDURE TRAY) ×3 IMPLANT
PAD ARMBOARD 7.5X6 YLW CONV (MISCELLANEOUS) ×6 IMPLANT
PORT ACCESS TROCAR AIRSEAL 12 (TROCAR) IMPLANT
PORT ACCESS TROCAR AIRSEAL 5M (TROCAR) ×1
SEAL CANN UNIV 5-8 DVNC XI (MISCELLANEOUS) ×8 IMPLANT
SEAL XI 5MM-8MM UNIVERSAL (MISCELLANEOUS) ×12
SEALER SYNCHRO 8 IS4000 DV (MISCELLANEOUS) ×3
SEALER SYNCHRO 8 IS4000 DVNC (MISCELLANEOUS) IMPLANT
SET IRRIG TUBING LAPAROSCOPIC (IRRIGATION / IRRIGATOR) IMPLANT
SET TUBE SMOKE EVAC HIGH FLOW (TUBING) ×3 IMPLANT
SHEET MEDIUM DRAPE 40X70 STRL (DRAPES) ×2 IMPLANT
SOL ANTI FOG 6CC (MISCELLANEOUS) IMPLANT
SOLUTION ANTI FOG 6CC (MISCELLANEOUS)
STAPLER CANNULA SEAL DVNC XI (STAPLE) IMPLANT
STAPLER CANNULA SEAL XI (STAPLE)
STOPCOCK 4 WAY LG BORE MALE ST (IV SETS) ×3 IMPLANT
SUT ETHIBOND 0 36 GRN (SUTURE) ×8 IMPLANT
SUT SILK  1 MH (SUTURE) ×3
SUT SILK 1 MH (SUTURE) ×2 IMPLANT
SUT SURGIDAC NAB ES-9 0 48 120 (SUTURE) ×1 IMPLANT
SUT V-LOC BARB 180 2/0GR6 GS22 (SUTURE) ×6
SUT VIC AB 3-0 SH 27 (SUTURE)
SUT VIC AB 3-0 SH 27X BRD (SUTURE) IMPLANT
SUT VIC AB 3-0 X1 27 (SUTURE) ×6 IMPLANT
SUT VICRYL 0 UR6 27IN ABS (SUTURE) ×6 IMPLANT
SUTURE V-LC BRB 180 2/0GR6GS22 (SUTURE) IMPLANT
SYR 10ML LL (SYRINGE) ×3 IMPLANT
SYR 50ML LL SCALE MARK (SYRINGE) ×3 IMPLANT
SYSTEM SAHARA CHEST DRAIN ATS (WOUND CARE) ×3 IMPLANT
TOWEL GREEN STERILE (TOWEL DISPOSABLE) ×3 IMPLANT
TOWEL GREEN STERILE FF (TOWEL DISPOSABLE) ×3 IMPLANT
TRAY FOLEY MTR SLVR 16FR STAT (SET/KITS/TRAYS/PACK) ×3 IMPLANT
TROCAR XCEL 12X100 BLDLESS (ENDOMECHANICALS) ×3 IMPLANT
TROCAR XCEL BLADELESS 5X75MML (TROCAR) ×3 IMPLANT
TROCAR XCEL NON-BLD 5MMX100MML (ENDOMECHANICALS) IMPLANT
TUBING EXTENTION W/L.L. (IV SETS) ×3 IMPLANT
WATER STERILE IRR 1000ML POUR (IV SOLUTION) ×6 IMPLANT

## 2020-12-09 NOTE — Op Note (Signed)
[address]       [place] [address]             [phone]        12/09/2020  Patient:  [name] Pre-Op Dx: Paraesophageal hernia   Morbid obesity with BMI of 51   GERD   Post-op Dx:  same Procedure: - Esophagoscopy - Robotic assisted laparoscopy - Paraesophageal hernia repair Myriad Matrix plegets - 7X10 myriad Matrix mesh overlay - Toupet Fundoplication   Surgeon and Role:      * Edgar Corrigan, Lucile Crater, MD - Primary    * T. Harriet Pho, PA-C - assisting  Anesthesia  general EBL:  50 ml Blood Administration: none Specimen:  none   Counts: correct   Indications: 45 year old female with a large paraesophageal hernia.  She also is morbidly obese with a BMI of 51.  I reviewed her images she does have a very large almost all of her stomach and the sac.  There is no evidence of volvulus.  Part of the body and tail of the pancreas also resides within the hernia sac.  Findings: Patulous esophagus on EGD.  Large hiatal hernia with the entire stomach, omentum, and part of the pancreas folding into the mediastinum.  We were able to obtain 3-4cm of intra-abdominal esophagus.  7 stitches were required to repair the hiatus.  Operative Technique: After the risks, benefits and alternatives were thoroughly discussed, the patient was brought to the operative theatre.  Anesthesia was induced, and the esophagoscope was passed through the oropharynx down to the stomach.  The scope was retroflexed and the hiatal hernia was clearly evident.  The scope was pulled back and the mucosal surface of the esophagus was visualized.    The esophagus was patulous.  The scope was then parked at 25 cm from the incisors.  The patient was then prepped and draped in normal sterile fashion.  An appropriate surgical pause was performed, and pre-operative antibiotics were dosed accordingly.  We began with a 1 cm incision 15 cm caudad from the xiphoid and slightly lateral to the  umbilicus.  Using an Optiview we entered the peritoneal space.  The abdomen was then insufflated with CO2.  3 other robotic ports were placed to triangulate the hiatus.  Another 12 mm port was placed in place at the level of the umbilicus laterally for an assistant port and another 5 mm trocar was placed in the right lower quadrant for liver retractor.  The patient was then placed in steep reverse Trendelenburg and the liver was elevated to expose the esophageal hiatus.  And then the robot was docked.  We began by dividing the gastrohepatic ligament to expose the right diaphragmatic crus and then dissected the hernia sac in a clockwise fashion to mobilize there the stomach and esophagus.  We then divided the short gastrics and moved towards the right crus and completed our dissection along the esophageal hiatus.  A Penrose drain was then used to encircle the the esophagus and we continued our dissection up into the mediastinum.  Once we had achieved 3 to 4 cm of intra-abdominal esophagus we then proceeded to reapproximate the crura with 0 Ethibond sutures in an interrupted fashion.  Absorbable mesh pledgets were used to buttress the hiatal reconstruciton.  The gastroscope was passed down through the lower esophageal sphincter into the stomach and would act as our bougie during this repair.  A U-shaped patch was then used to reinforce the  repair posteriorly.  Next the stomach was passed posterior to the esophagus and Toupet fundoplication was performed.  An air leak test was performed using the gastroscope.  No leak was evident.  The liver retractor was removed and all ports were removed under direct visualization.  The skin and soft tissue were closed with absorbable suture    The patient tolerated the procedure without any immediate complications, and was transferred to the PACU in stable condition.  Marlene Pfluger Bary Leriche

## 2020-12-09 NOTE — Brief Op Note (Signed)
12/09/2020  1:06 PM  PATIENT:  Tiffany Chambers  45 y.o. female  PRE-OPERATIVE DIAGNOSIS:  paraesophageal hiatal hernia  POST-OPERATIVE DIAGNOSIS:  paraesophageal hiatal hernia  PROCEDURE:  Procedure(s) with comments: XI ROBOTIC ASSISTED HIATAL HERNIA REPAIR (N/A) - EGD required ESOPHAGOGASTRODUODENOSCOPY (EGD) (N/A)  SURGEON:  Surgeon(s) and Role:    * Lightfoot, Eliezer Lofts, MD - Primary  PHYSICIAN ASSISTANT:   Jari Favre, PA-C   ANESTHESIA:   general  EBL:  50 mL   BLOOD ADMINISTERED:none  DRAINS: NONE   LOCAL MEDICATIONS USED:  BUPIVICAINE   SPECIMEN:  No Specimen  DISPOSITION OF SPECIMEN:  N/A  COUNTS:  YES  DICTATION: .Dragon Dictation  PLAN OF CARE: Admit to inpatient   PATIENT DISPOSITION:  PACU - hemodynamically stable.   Delay start of Pharmacological VTE agent (>24hrs) due to surgical blood loss or risk of bleeding: no

## 2020-12-09 NOTE — Anesthesia Preprocedure Evaluation (Signed)
Anesthesia Evaluation  Patient identified by MRN, date of birth, ID band Patient awake    Reviewed: Allergy & Precautions, H&P , NPO status , Patient's Chart, lab work & pertinent test results  Airway Mallampati: III  TM Distance: >3 FB Neck ROM: Full    Dental no notable dental hx. (+) Teeth Intact, Dental Advisory Given   Pulmonary neg pulmonary ROS, former smoker,    Pulmonary exam normal breath sounds clear to auscultation       Cardiovascular negative cardio ROS   Rhythm:Regular Rate:Normal     Neuro/Psych  Headaches, Depression    GI/Hepatic Neg liver ROS, GERD  Medicated,  Endo/Other  Morbid obesity  Renal/GU negative Renal ROS  negative genitourinary   Musculoskeletal  (+) Arthritis ,   Abdominal   Peds  Hematology negative hematology ROS (+)   Anesthesia Other Findings   Reproductive/Obstetrics negative OB ROS                             Anesthesia Physical Anesthesia Plan  ASA: III  Anesthesia Plan: General   Post-op Pain Management:    Induction: Intravenous  PONV Risk Score and Plan: 4 or greater and Ondansetron, Dexamethasone and Midazolam  Airway Management Planned: Oral ETT  Additional Equipment:   Intra-op Plan:   Post-operative Plan: Extubation in OR  Informed Consent: I have reviewed the patients History and Physical, chart, labs and discussed the procedure including the risks, benefits and alternatives for the proposed anesthesia with the patient or authorized representative who has indicated his/her understanding and acceptance.     Dental advisory given  Plan Discussed with: CRNA  Anesthesia Plan Comments:         Anesthesia Quick Evaluation

## 2020-12-09 NOTE — Anesthesia Postprocedure Evaluation (Signed)
Anesthesia Post Note  Patient: Tiffany Chambers  Procedure(s) Performed: XI ROBOTIC ASSISTED HIATAL HERNIA REPAIR (N/A Chest) ESOPHAGOGASTRODUODENOSCOPY (EGD) (N/A Throat)     Patient location during evaluation: PACU Anesthesia Type: General Level of consciousness: awake and alert Pain management: pain level controlled Vital Signs Assessment: post-procedure vital signs reviewed and stable Respiratory status: spontaneous breathing, nonlabored ventilation, respiratory function stable and patient connected to nasal cannula oxygen Cardiovascular status: blood pressure returned to baseline and stable Postop Assessment: no apparent nausea or vomiting Anesthetic complications: no   No complications documented.  Last Vitals:  Vitals:   12/09/20 1545 12/09/20 1600  BP: 129/75 127/73  Pulse: 88 87  Resp: 10 10  Temp:  36.8 C  SpO2: 95% 93%    Last Pain:  Vitals:   12/09/20 1605  TempSrc:   PainSc: 8                  Ihan Pat,W. EDMOND

## 2020-12-09 NOTE — Progress Notes (Signed)
     [address]       [place] [address]             [phone]       No events.  Complains of headache  Vitals:   12/09/20 0444 12/09/20 0810  BP: (!) 147/91 136/89  Pulse: 69 73  Resp: 12 16  Temp: 98.1 F (36.7 C) 97.9 F (36.6 C)  SpO2: 100% 97%   Alert NAD Sinus EWOB  OR today for EGD, robotic assisted paraesophageal hernia repair with fundoplication  Tishawna Larouche O Yecenia Dalgleish

## 2020-12-09 NOTE — Progress Notes (Signed)
Pt arrived to unit from PACU VSS, A/O x 4, pt has 6 port sites on abdomen with skin glue   CCMD called ,CHG given, pt oriented to unit,Will continue to monitor.   Karna Christmas Onesty Clair, RN     12/09/20 1639  Vitals  Temp 98.5 F (36.9 C)  Temp Source Oral  BP 127/79  MAP (mmHg) 94  BP Location Right Arm  BP Method Automatic  Patient Position (if appropriate) Lying  Pulse Rate 98  Pulse Rate Source Monitor  ECG Heart Rate 98  Resp 12  Level of Consciousness  Level of Consciousness Alert  Oxygen Therapy  SpO2 95 %  O2 Device Nasal Cannula  O2 Flow Rate (L/min) 3 L/min  Pain Assessment  Pain Scale 0-10  Pain Score 7  Pain Type Surgical pain  Pain Location Shoulder  Pain Orientation Left  MEWS Score  MEWS Temp 0  MEWS Systolic 0  MEWS Pulse 0  MEWS RR 1  MEWS LOC 0  MEWS Score 1  MEWS Score Color Chilton Si

## 2020-12-09 NOTE — Transfer of Care (Signed)
Immediate Anesthesia Transfer of Care Note  Patient: Tiffany Chambers  Procedure(s) Performed: XI ROBOTIC ASSISTED HIATAL HERNIA REPAIR (N/A Chest) ESOPHAGOGASTRODUODENOSCOPY (EGD) (N/A Throat)  Patient Location: PACU  Anesthesia Type:General  Level of Consciousness: drowsy and patient cooperative  Airway & Oxygen Therapy: Patient Spontanous Breathing and Patient connected to face mask oxygen  Post-op Assessment: Report given to RN and Post -op Vital signs reviewed and stable  Post vital signs: Reviewed and stable  Last Vitals:  Vitals Value Taken Time  BP 121/73 12/09/20 1515  Temp    Pulse 94 12/09/20 1518  Resp 18 12/09/20 1518  SpO2 94 % 12/09/20 1518  Vitals shown include unvalidated device data.  Last Pain:  Vitals:   12/09/20 0953  TempSrc: Oral  PainSc:       Patients Stated Pain Goal: 3 (12/09/20 0947)  Complications: No complications documented.

## 2020-12-09 NOTE — Anesthesia Procedure Notes (Signed)
Procedure Name: Intubation Date/Time: 12/09/2020 11:02 AM Performed by: Thelma Comp, CRNA Pre-anesthesia Checklist: Patient identified, Emergency Drugs available, Suction available and Patient being monitored Patient Re-evaluated:Patient Re-evaluated prior to induction Oxygen Delivery Method: Circle System Utilized Preoxygenation: Pre-oxygenation with 100% oxygen Induction Type: IV induction Ventilation: Two handed mask ventilation required and Oral airway inserted - appropriate to patient size Laryngoscope Size: Mac and 3 Grade View: Grade I Tube type: Oral Tube size: 7.0 mm Number of attempts: 1 Airway Equipment and Method: Stylet and Oral airway Placement Confirmation: ETT inserted through vocal cords under direct vision,  positive ETCO2 and breath sounds checked- equal and bilateral Secured at: 21 cm Tube secured with: Tape Dental Injury: Teeth and Oropharynx as per pre-operative assessment

## 2020-12-10 ENCOUNTER — Inpatient Hospital Stay (HOSPITAL_COMMUNITY): Payer: Managed Care, Other (non HMO)

## 2020-12-10 ENCOUNTER — Encounter (HOSPITAL_COMMUNITY): Payer: Self-pay | Admitting: Thoracic Surgery (Cardiothoracic Vascular Surgery)

## 2020-12-10 ENCOUNTER — Encounter: Payer: Managed Care, Other (non HMO) | Admitting: Podiatry

## 2020-12-10 LAB — CBC
HCT: 32.3 % — ABNORMAL LOW (ref 36.0–46.0)
Hemoglobin: 10.6 g/dL — ABNORMAL LOW (ref 12.0–15.0)
MCH: 30.1 pg (ref 26.0–34.0)
MCHC: 32.8 g/dL (ref 30.0–36.0)
MCV: 91.8 fL (ref 80.0–100.0)
Platelets: 295 10*3/uL (ref 150–400)
RBC: 3.52 MIL/uL — ABNORMAL LOW (ref 3.87–5.11)
RDW: 12.5 % (ref 11.5–15.5)
WBC: 9.7 10*3/uL (ref 4.0–10.5)
nRBC: 0 % (ref 0.0–0.2)

## 2020-12-10 LAB — BASIC METABOLIC PANEL
Anion gap: 10 (ref 5–15)
BUN: 6 mg/dL (ref 6–20)
CO2: 24 mmol/L (ref 22–32)
Calcium: 8.3 mg/dL — ABNORMAL LOW (ref 8.9–10.3)
Chloride: 102 mmol/L (ref 98–111)
Creatinine, Ser: 0.68 mg/dL (ref 0.44–1.00)
GFR, Estimated: >60 mL/min (ref >60)
Glucose, Bld: 114 mg/dL — ABNORMAL HIGH (ref 70–99)
Potassium: 4.1 mmol/L (ref 3.5–5.1)
Sodium: 136 mmol/L (ref 135–145)

## 2020-12-10 MED ORDER — METOCLOPRAMIDE HCL 5 MG/ML IJ SOLN
10.0000 mg | Freq: Four times a day (QID) | INTRAMUSCULAR | Status: DC
  Administered 2020-12-10 – 2020-12-11 (×3): 10 mg via INTRAVENOUS
  Filled 2020-12-10 (×3): qty 2

## 2020-12-10 MED ORDER — DIATRIZOATE MEGLUMINE & SODIUM 66-10 % PO SOLN
120.0000 mL | Freq: Once | ORAL | Status: AC
  Administered 2020-12-10: 55 mL via ORAL
  Filled 2020-12-10: qty 120

## 2020-12-10 MED ORDER — PANTOPRAZOLE SODIUM 40 MG PO PACK
40.0000 mg | PACK | Freq: Every day | ORAL | Status: DC
  Administered 2020-12-10: 40 mg via ORAL
  Filled 2020-12-10 (×2): qty 20

## 2020-12-10 MED ORDER — ONDANSETRON HCL 4 MG/2ML IJ SOLN
4.0000 mg | Freq: Four times a day (QID) | INTRAMUSCULAR | Status: AC
  Administered 2020-12-10 – 2020-12-11 (×4): 4 mg via INTRAVENOUS
  Filled 2020-12-10 (×4): qty 2

## 2020-12-10 NOTE — Plan of Care (Signed)
  Problem: Clinical Measurements: Goal: Cardiovascular complication will be avoided Outcome: Progressing   Problem: Coping: Goal: Level of anxiety will decrease Outcome: Progressing   

## 2020-12-10 NOTE — Progress Notes (Addendum)
      [address]       [name] [address]             [phone]      1 Day Post-Op Procedure(s) (LRB): XI ROBOTIC ASSISTED HIATAL HERNIA REPAIR (N/A) ESOPHAGOGASTRODUODENOSCOPY (EGD) (N/A) Subjective: Feels okay this morning, she did have some discomfort while swallowing a tylenol pill but otherwise her pain has been well controlled  Objective: Vital signs in last 24 hours: Temp:  [97.5 F (36.4 C)-99.5 F (37.5 C)] 98.7 F (37.1 C) (06/07 0308) Pulse Rate:  [73-98] 91 (06/07 0308) Cardiac Rhythm: Normal sinus rhythm (06/06 2000) Resp:  [10-18] 17 (06/07 0308) BP: (121-149)/(71-95) 121/79 (06/07 0308) SpO2:  [92 %-98 %] 95 % (06/07 0308) Weight:  [136.1 kg] 136.1 kg (06/06 0953)  Hemodynamic parameters for last 24 hours Intake/Output from previous day: 06/06 0701 - 06/07 0700 In: 2453.6 [I.V.:2353.6; IV Piggyback:100] Out: 375 [Urine:325; Blood:50] Intake/Output this shift: No intake/output data recorded.  General appearance: alert, cooperative and no distress Heart: regular rate and rhythm, S1, S2 normal, no murmur, click, rub or gallop Lungs: clear to auscultation bilaterally Abdomen: soft, non-tender; bowel sounds normal; no masses,  no organomegaly Extremities: extremities normal, atraumatic, no cyanosis or edema Wound: clean and dry  Lab Results: Recent Labs    12/09/20 1555 12/10/20 0248  WBC 14.5* 9.7  HGB 12.0 10.6*  HCT 36.5 32.3*  PLT 351 295   BMET:  Recent Labs    12/08/20 0052 12/09/20 1555 12/10/20 0120  NA 135  --  136  K 3.8  --  4.1  CL 103  --  102  CO2 24  --  24  GLUCOSE 95  --  114*  BUN 8  --  6  CREATININE 0.76 0.72 0.68  CALCIUM 8.5*  --  8.3*    PT/INR: No results for input(s): LABPROT, INR in the last 72 hours. ABG No results found for: PHART, HCO3, TCO2, ACIDBASEDEF, O2SAT CBG (last 3)  No results for input(s): GLUCAP in the last 72 hours.  Assessment/Plan: S/P Procedure(s) (LRB): XI ROBOTIC  ASSISTED HIATAL HERNIA REPAIR (N/A) ESOPHAGOGASTRODUODENOSCOPY (EGD) (N/A)  1. NSR in the 90s, BP well controlled 2. Tolerating room air, CXR with left upper lobe atelectasis. No pneumothorax.  3.  H and H 10.6/32.3, stable 4. NPO, swallow study this morning, zofran q6 x 24 hours 5. Pain well controlled on current regimen  Plan: Swallow study this morning, possible advancement of diet. Continue to work on pain control. Holding off on IS for now due to location of surgery. Encouraged to ambulate in the halls.      LOS: 3 days    Tiffany Chambers 12/10/2020   No events Swallow looks good Will start dysph 1 diet Home later today if she tolerates diet.  Puree diet and crushed meds for 1 week Virtual visit in 1 week  Tiffany Chambers

## 2020-12-10 NOTE — Discharge Summary (Signed)
Physician Discharge Summary  Patient ID: Tiffany Chambers MRN: [id number] DOB/AGE: 46-01-1976 46 y.o.  Admit date: 12/07/2020 Discharge date: 12/11/2020  Admission Diagnoses:  Patient Active Problem List   Diagnosis Date Noted  . Hiatal hernia with GERD 12/07/2020  . 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 Heel spur, right   . Morbid obesity ([place]) 07/12/2020  . Plantar fasciitis 07/12/2020  . Alopecia 10/10/2019  . Menorrhagia 10/10/2019  . GERD (gastroesophageal reflux disease) 08/27/2016  . Depression 08/27/2016    Discharge Diagnoses:  Active Problems:   Hiatal hernia with GERD   S/P robot-assisted surgical procedure   Discharged Condition: good  HPI:   Tiffany Chambers 45 y.o. female transferred from an hospital with a diagnosis of large paraesophageal hiatal hernia.  Last night she was awoken from sleep with acute onset epigastric and right upper quadrant pain.  This was followed by 3 episodes of emesis.  The first episode there was some bloody streaks, but the last 2 consisted mostly of bile.  She continues to have some epigastric pain but this is improved pain medication.  She has received antibiotics since being in the hospital.  She denies any nausea currently.   Hospital Course:   Tiffany Chambers underwent a robotic-assisted repair of a paraesophageal hiatal hernia with Dr. Kipp Brood. She tolerated the procedure well and was transferred to the cardiac telemetry floor in stable condition. She underwent a swallow study POD 1 and it showed Normal/expected postoperative appearance without complicating features such as contrast extravasation/leakage. She is instructed to continue a baby food consistency soft diet and liquid/crushed pills. She will follow-up with Dr. Kipp Brood with a virtual appointment in about a week.   Consults: None  Significant Diagnostic Studies:  Swallow study showed:   CLINICAL DATA:  Postop paraesophageal hernia  repair.  EXAM: ESOPHOGRAM/BARIUM SWALLOW  TECHNIQUE: Single contrast examination was performed using water-soluble contrast.  FLUOROSCOPY TIME:  Fluoroscopy Time:  1 minutes and 12 seconds  Radiation Exposure Index (if provided by the fluoroscopic device): 26.7 mGy  Number of Acquired Spot Images: 0  COMPARISON:  CT scan 12/07/2020  FINDINGS: Expected postoperative changes from Nissen fundoplication. The wrap is intact. No leakage/extravasation of contrast material is identified. Expected narrowing of the esophagus through the wrap likely due to postoperative inflammation/edema. The fundal region of the stomach is unremarkable.  IMPRESSION: Normal/expected postoperative appearance without complicating features such as contrast extravasation/leakage.   Electronically Signed   By: Marijo Sanes M.D.   On: 12/10/2020 11:01    Treatments:  12/09/2020  Patient:  Tiffany Chambers Pre-Op Dx: Paraesophageal hernia                         Morbid obesity with BMI of 51                         GERD   Post-op Dx:  same Procedure: - Esophagoscopy - Robotic assisted laparoscopy - Paraesophageal hernia repair Myriad Matrix plegets - 7X10 myriad Matrix mesh overlay - Toupet Fundoplication   Surgeon and Role:      * Lightfoot, Lucile Crater, MD - Primary    * T. Harriet Pho, PA-C - assisting  Anesthesia  general EBL:  50 ml Blood Administration: none Specimen:  none   Counts: correct   Indications: 45 year old female with a large paraesophageal hernia. She also is morbidly obese with a BMI of 51. I reviewed her images she does have  a very large almost all of her stomach and the sac. There is no evidence of volvulus. Part of the body and tail of the pancreas also resides within the hernia sac.  Findings: Patulous esophagus on EGD.  Large hiatal hernia with the entire stomach, omentum, and part of the pancreas folding into the mediastinum.  We were  able to obtain 3-4cm of intra-abdominal esophagus.  7 stitches were required to repair the hiatus.  Discharge Exam: Blood pressure (!) 151/85, pulse 91, temperature 97.9 F (36.6 C), temperature source Oral, resp. rate 18, height '5\' 4"'  (1.626 m), weight 136.1 kg, SpO2 95 %.   General appearance: alert, cooperative and no distress Heart: regular rate and rhythm, S1, S2 normal, no murmur, click, rub or gallop Lungs: clear to auscultation bilaterally Abdomen: soft, non-tender; bowel sounds normal; no masses,  no organomegaly Extremities: extremities normal, atraumatic, no cyanosis or edema Wound: clean and dry   Disposition: Discharge disposition: 01-Home or Self Care        Allergies as of 12/11/2020      Reactions   Morphine And Related Itching      Medication List    STOP taking these medications   citalopram 20 MG tablet Commonly known as: CELEXA Replaced by: citalopram 10 MG/5ML suspension   diphenhydramine-acetaminophen 25-500 MG Tabs tablet Commonly known as: TYLENOL PM   Gianvi 3-0.02 MG tablet Generic drug: drospirenone-ethinyl estradiol   multivitamin capsule   ondansetron 4 MG tablet Commonly known as: Zofran Replaced by: ondansetron 4 MG/5ML solution   vitamin E 180 MG (400 UNITS) capsule     TAKE these medications   citalopram 10 MG/5ML suspension Commonly known as: CELEXA Take 10 mLs (20 mg total) by mouth daily. Replaces: citalopram 20 MG tablet   esomeprazole 20 MG capsule Commonly known as: NEXIUM Take 1 capsule (20 mg total) by mouth daily. You can open the capsule and sprinkle the contents into apple sauce What changed: additional instructions   HYDROcodone-acetaminophen 7.5-325 mg/15 ml solution Commonly known as: HYCET Take 10 mLs by mouth every 6 (six) hours as needed for moderate pain.   neomycin-bacitracin-polymyxin ointment Commonly known as: NEOSPORIN Apply 1 application topically as needed for wound care.   ondansetron 4  MG/5ML solution Commonly known as: ZOFRAN Take 5 mLs (4 mg total) by mouth every 8 (eight) hours as needed for nausea or vomiting. Replaces: ondansetron 4 MG tablet       Follow-up Information    Susy Frizzle, MD. Call in 1 day(s).   Specialty: Family Medicine Contact information: [address] [place] [address] [phone]        Lajuana Matte, MD Follow up.   Specialty: Cardiothoracic Surgery Why: Patient scheduled for virtual on 6/20 '@3' :10pm Contact information: [place] [address] [phone]               Signed: Elgie Collard 12/11/2020, 8:09 AM

## 2020-12-10 NOTE — Discharge Instructions (Signed)
Discharge Instructions:  1. You may shower, please wash incisions daily with soap and water and keep dry.  If you wish to cover wounds with dressing you may do so but please keep clean and change daily.  No tub baths or swimming until incisions have completely healed.  If your incisions become red or develop any drainage please call our office at [phone]  2. No Driving until cleared by our office and you are no longer using narcotic pain medications  3. Monitor your weight daily.. Please use the same scale and weigh at same time... If you gain 3-5 lbs in 48 hours with associated lower extremity swelling, please contact our office at [phone]  4. Fever of 101.5 for at least 24 hours with no source, please contact our office at [phone]  5. Activity- up as tolerated, please walk at least 3 times per day.  Avoid strenuous activity, no lifting, pushing, or pulling with your arms over 8-10 lbs for a minimum of 6 weeks  6. If any questions or concerns arise, please do not hesitate to contact our office at [phone]  7. Puree diet and crushed meds for 1 week. Virtual follow-up with Dr. Cliffton Asters in 1 week.

## 2020-12-11 MED ORDER — CITALOPRAM HYDROBROMIDE 10 MG/5ML PO SOLN: 20.0000 mg | Freq: Every day | ORAL | 12 refills | Status: DC

## 2020-12-11 MED ORDER — ESOMEPRAZOLE MAGNESIUM 20 MG PO CPDR: 20.0000 mg | DELAYED_RELEASE_CAPSULE | Freq: Every day | ORAL | 1 refills | Status: DC

## 2020-12-11 MED ORDER — ONDANSETRON HCL 4 MG/5ML PO SOLN: 4.0000 mg | Freq: Three times a day (TID) | ORAL | 0 refills | Status: AC | PRN

## 2020-12-11 MED ORDER — HYDROCODONE-ACETAMINOPHEN 7.5-325 MG/15ML PO SOLN: 10.0000 mL | Freq: Four times a day (QID) | ORAL | 0 refills | Status: DC | PRN

## 2020-12-11 NOTE — Progress Notes (Signed)
      [address]       [place] [address]             [phone]      2 Days Post-Op Procedure(s) (LRB): XI ROBOTIC ASSISTED HIATAL HERNIA REPAIR (N/A) ESOPHAGOGASTRODUODENOSCOPY (EGD) (N/A) Subjective: Feels good this morning, no complaints  Objective: Vital signs in last 24 hours: Temp:  [97.6 F (36.4 C)-98.9 F (37.2 C)] 97.9 F (36.6 C) (06/08 0717) Pulse Rate:  [85-96] 91 (06/08 0717) Cardiac Rhythm: Sinus tachycardia (06/07 1900) Resp:  [15-20] 18 (06/08 0717) BP: (131-151)/(78-91) 151/85 (06/08 0717) SpO2:  [93 %-96 %] 95 % (06/08 0455)     Intake/Output from previous day: 06/07 0701 - 06/08 0700 In: 240 [P.O.:240] Out: 550 [Urine:550] Intake/Output this shift: No intake/output data recorded.  General appearance: alert, cooperative and no distress Heart: regular rate and rhythm, S1, S2 normal, no murmur, click, rub or gallop Lungs: clear to auscultation bilaterally Abdomen: soft, non-tender; bowel sounds normal; no masses,  no organomegaly Extremities: extremities normal, atraumatic, no cyanosis or edema Wound: clean and dry  Lab Results: Recent Labs    12/09/20 1555 12/10/20 0248  WBC 14.5* 9.7  HGB 12.0 10.6*  HCT 36.5 32.3*  PLT 351 295   BMET:  Recent Labs    12/09/20 1555 12/10/20 0120  NA  --  136  K  --  4.1  CL  --  102  CO2  --  24  GLUCOSE  --  114*  BUN  --  6  CREATININE 0.72 0.68  CALCIUM  --  8.3*    PT/INR: No results for input(s): LABPROT, INR in the last 72 hours. ABG No results found for: PHART, HCO3, TCO2, ACIDBASEDEF, O2SAT CBG (last 3)  No results for input(s): GLUCAP in the last 72 hours.  Assessment/Plan: S/P Procedure(s) (LRB): XI ROBOTIC ASSISTED HIATAL HERNIA REPAIR (N/A) ESOPHAGOGASTRODUODENOSCOPY (EGD) (N/A)  1. NSR in the 90s, BP well controlled 2. Tolerating room air, CXR with left upper lobe atelectasis. No pneumothorax.  3.  H and H 10.6/32.3, stable 4. Dysphagia 1 diet, has  Zofran IV Protonix IV & added Reglan IV yesterday. Swallow study without contrast extravasation/leak. Expected postop appearance.  5. Pain well controlled on current regimen  Plan: Discharge today, instructions reviewed with the patient. Crushed/liquid meds until follow-up. Tolerating a pureed diet but cannot take more than a few bites. Encouraged to eat 6x daily, small meals to get the appropriate calories.    LOS: 4 days    Tiffany Chambers 12/11/2020

## 2020-12-11 NOTE — Progress Notes (Signed)
Patient given discharge instructions. Husband present. PIV removed. Telemetry box removed, CCMD notified. Personal belongings packed up and taken with patient. Patient taken to vehicle by wheelchair by staff.  Kenard Gower, RN

## 2020-12-12 ENCOUNTER — Telehealth: Payer: Self-pay | Admitting: Family Medicine

## 2020-12-23 ENCOUNTER — Other Ambulatory Visit: Payer: Self-pay

## 2020-12-23 ENCOUNTER — Telehealth (INDEPENDENT_AMBULATORY_CARE_PROVIDER_SITE_OTHER): Payer: Managed Care, Other (non HMO) | Admitting: Thoracic Surgery (Cardiothoracic Vascular Surgery)

## 2020-12-23 DIAGNOSIS — Z9889 Other specified postprocedural states: Secondary | ICD-10-CM

## 2020-12-23 NOTE — Progress Notes (Signed)
     [address]       [name] [address]             [phone]       Patient: Home Provider: Office Consent for Telemedicine visit obtained.  Today's visit was completed via a real-time telehealth (see specific modality noted below). The patient/authorized person provided oral consent at the time of the visit to engage in a telemedicine encounter with the present provider at [facility]. The patient/authorized person was informed of the potential benefits, limitations, and risks of telemedicine. The patient/authorized person expressed understanding that the laws that protect confidentiality also apply to telemedicine. The patient/authorized person acknowledged understanding that telemedicine does not provide emergency services and that he or she would need to call 911 or proceed to the nearest hospital for help if such a need arose.   Total time spent in the clinical discussion 10 minutes.  Telehealth Modality: Phone visit (audio only)  I had a telephone visit with Tiffany Chambers.  Overall she is doing well.  She states that she is eating peanut butter and jelly sandwiches, and lasagna.  She occasionally has some dysphagia with this.  She denies any nausea.  She occasionally has some pain with deep breaths and yawning.  I have instructed her to advance her diet slowly over the next few weeks.  She no longer needs to crush any of her medications.  She will follow-up in 1 month with a chest x-ray

## 2021-01-09 ENCOUNTER — Encounter (HOSPITAL_COMMUNITY): Payer: Self-pay | Admitting: Emergency Medicine

## 2021-01-09 ENCOUNTER — Other Ambulatory Visit: Payer: Self-pay

## 2021-01-09 ENCOUNTER — Emergency Department (HOSPITAL_COMMUNITY)
Admission: EM | Admit: 2021-01-09 | Discharge: 2021-01-09 | Disposition: A | Discharge status: Home / Self Care | Payer: Managed Care, Other (non HMO) | Attending: Physician Assistant | Admitting: Physician Assistant

## 2021-01-09 ENCOUNTER — Emergency Department (HOSPITAL_COMMUNITY): Payer: Managed Care, Other (non HMO)

## 2021-01-09 DIAGNOSIS — R1110 Vomiting, unspecified: Secondary | ICD-10-CM | POA: Diagnosis not present

## 2021-01-09 DIAGNOSIS — Z5321 Procedure and treatment not carried out due to patient leaving prior to being seen by health care provider: Secondary | ICD-10-CM | POA: Diagnosis not present

## 2021-01-09 DIAGNOSIS — R109 Unspecified abdominal pain: Secondary | ICD-10-CM | POA: Diagnosis present

## 2021-01-09 LAB — COMPREHENSIVE METABOLIC PANEL
ALT: 11 U/L (ref 0–44)
AST: 37 U/L (ref 15–41)
Albumin: 3.3 g/dL — ABNORMAL LOW (ref 3.5–5.0)
Alkaline Phosphatase: 98 U/L (ref 38–126)
Anion gap: 10 (ref 5–15)
BUN: 10 mg/dL (ref 6–20)
CO2: 21 mmol/L — ABNORMAL LOW (ref 22–32)
Calcium: 9 mg/dL (ref 8.9–10.3)
Chloride: 103 mmol/L (ref 98–111)
Creatinine, Ser: 0.81 mg/dL (ref 0.44–1.00)
GFR, Estimated: >60 mL/min (ref >60)
Glucose, Bld: 101 mg/dL — ABNORMAL HIGH (ref 70–99)
Potassium: 4.3 mmol/L (ref 3.5–5.1)
Sodium: 134 mmol/L — ABNORMAL LOW (ref 135–145)
Total Bilirubin: 1.3 mg/dL — ABNORMAL HIGH (ref 0.3–1.2)
Total Protein: 6.7 g/dL (ref 6.5–8.1)

## 2021-01-09 LAB — CBC WITH DIFFERENTIAL/PLATELET
Abs Immature Granulocytes: 0.03 10*3/uL (ref 0.00–0.07)
Basophils Absolute: 0 10*3/uL (ref 0.0–0.1)
Basophils Relative: 0 %
Eosinophils Absolute: 0.2 10*3/uL (ref 0.0–0.5)
Eosinophils Relative: 2 %
HCT: 35.5 % — ABNORMAL LOW (ref 36.0–46.0)
Hemoglobin: 12.1 g/dL (ref 12.0–15.0)
Immature Granulocytes: 0 %
Lymphocytes Relative: 24 %
Lymphs Abs: 2.2 10*3/uL (ref 0.7–4.0)
MCH: 30.2 pg (ref 26.0–34.0)
MCHC: 34.1 g/dL (ref 30.0–36.0)
MCV: 88.5 fL (ref 80.0–100.0)
Monocytes Absolute: 0.6 10*3/uL (ref 0.1–1.0)
Monocytes Relative: 6 %
Neutro Abs: 6.3 10*3/uL (ref 1.7–7.7)
Neutrophils Relative %: 68 %
Platelets: 333 10*3/uL (ref 150–400)
RBC: 4.01 MIL/uL (ref 3.87–5.11)
RDW: 12.3 % (ref 11.5–15.5)
WBC: 9.4 10*3/uL (ref 4.0–10.5)
nRBC: 0 % (ref 0.0–0.2)

## 2021-01-09 LAB — LIPASE, BLOOD: Lipase: 27 U/L (ref 11–51)

## 2021-01-09 NOTE — ED Triage Notes (Signed)
Patient c/o mid abdominal pain onset about an hour ago. States pain caused her to have an episode of emesis. States hernia repair last month.

## 2021-01-09 NOTE — ED Provider Notes (Signed)
Emergency Medicine Provider Triage Evaluation Note  Tiffany Chambers , a 45 y.o. female  was evaluated in triage.  Pt complains of 1 hour ago started having epigastric abdominal pain and 1 episode of nonbloody, nonbilious emesis.  Had hernia repair surgery 1 month ago and has been fine up until today.  No fever or diarrhea.  Review of Systems  Positive: Epigastric pain, vomiting Negative: Diarrhea  Physical Exam  BP (!) 143/94 (BP Location: Left Arm)   Pulse 98   Temp 98.6 F (37 C) (Oral)   Resp 20   Ht 5\' 4"  (1.626 m)   Wt 136.1 kg   LMP  (LMP Unknown)   SpO2 100%   BMI 51.49 kg/m  Gen:   Awake, no distress   Resp:  Normal effort  MSK:   Moves extremities without difficulty  Other:  Appears uncomfortable palpation  Medical Decision Making  Medically screening exam initiated at 3:34 PM.  Appropriate orders placed.  Tiffany Chambers was informed that the remainder of the evaluation will be completed by another provider, this initial triage assessment does not replace that evaluation, and the importance of remaining in the ED until their evaluation is complete.  Lab work and imaging ordered   Lyndle Herrlich, PA-C 01/09/21 1534    03/12/21, MD 01/11/21 1357

## 2021-01-09 NOTE — ED Notes (Signed)
Pt left AMA due to wait time  

## 2021-01-10 ENCOUNTER — Other Ambulatory Visit: Payer: Self-pay | Admitting: Thoracic Surgery (Cardiothoracic Vascular Surgery)

## 2021-01-10 ENCOUNTER — Telehealth: Payer: Self-pay | Admitting: Thoracic Surgery (Cardiothoracic Vascular Surgery)

## 2021-01-10 ENCOUNTER — Ambulatory Visit
Admission: RE | Admit: 2021-01-10 | Discharge: 2021-01-10 | Disposition: A | Discharge status: Home / Self Care | Payer: Managed Care, Other (non HMO) | Source: Ambulatory Visit | Attending: Thoracic Surgery (Cardiothoracic Vascular Surgery) | Admitting: Thoracic Surgery (Cardiothoracic Vascular Surgery)

## 2021-01-10 ENCOUNTER — Other Ambulatory Visit: Payer: Self-pay

## 2021-01-10 DIAGNOSIS — R1031 Right lower quadrant pain: Secondary | ICD-10-CM

## 2021-01-10 DIAGNOSIS — R1110 Vomiting, unspecified: Secondary | ICD-10-CM

## 2021-01-10 DIAGNOSIS — R0789 Other chest pain: Secondary | ICD-10-CM

## 2021-01-10 DIAGNOSIS — R197 Diarrhea, unspecified: Secondary | ICD-10-CM

## 2021-01-10 MED ORDER — IOPAMIDOL (ISOVUE-300) INJECTION 61%
125.0000 mL | Freq: Once | INTRAVENOUS | Status: AC | PRN
  Administered 2021-01-10: 125 mL via INTRAVENOUS

## 2021-01-10 NOTE — Progress Notes (Signed)
.  h 

## 2021-01-10 NOTE — Telephone Encounter (Signed)
-----   Message from Joycelyn Schmid, LPN sent at 11/12/4583  4:37 PM EDT ----- Regarding: test results The ABD CT has been done-today,(ready for your review) Her insurance denied the Chest CT. She is scheduled for the Barium swallow on Monday at [facility].

## 2021-01-13 ENCOUNTER — Inpatient Hospital Stay (HOSPITAL_COMMUNITY): Admission: RE | Admit: 2021-01-13 | Payer: Managed Care, Other (non HMO) | Source: Ambulatory Visit

## 2021-01-13 ENCOUNTER — Telehealth: Payer: Self-pay

## 2021-01-13 ENCOUNTER — Telehealth: Payer: Self-pay | Admitting: Family Medicine

## 2021-01-13 NOTE — Telephone Encounter (Signed)
Patient contacted the office stating that she was still having abdominal pain that was not easing up. She stated that she has already contacted her PCP's office with no response back.  She stated that [place] and advised her that symptoms sounds like gallbladder problems. She was requesting a HIDA scan. She is s/p RATS Hiatal hernia repair with Dr. Cliffton Asters 12/09/20. Dr. Cliffton Asters aware, stated recent CT scan normal. Advised that she would need to be further evaluated in the ER for possible gallbladder problems. She acknowledged receipt.

## 2021-01-13 NOTE — Telephone Encounter (Signed)
Pt called in complaining of pain in the stomach, pt thinks it may be a gallbladder issue. Pt stated that she has been in pain throughout the weekend. No appts were available today for pt. Please advise  Cb#: [phone]

## 2021-01-14 ENCOUNTER — Emergency Department (HOSPITAL_COMMUNITY): Payer: Managed Care, Other (non HMO)

## 2021-01-14 ENCOUNTER — Encounter (HOSPITAL_COMMUNITY): Payer: Self-pay | Admitting: Emergency Medicine

## 2021-01-14 ENCOUNTER — Other Ambulatory Visit: Payer: Self-pay

## 2021-01-14 ENCOUNTER — Inpatient Hospital Stay (HOSPITAL_COMMUNITY)
Admission: EM | Admit: 2021-01-14 | Discharge: 2021-01-17 | DRG: 418 | Disposition: A | Discharge status: Home / Self Care | Payer: Managed Care, Other (non HMO) | Attending: General Surgery | Admitting: General Surgery

## 2021-01-14 DIAGNOSIS — Z20822 Contact with and (suspected) exposure to covid-19: Secondary | ICD-10-CM | POA: Diagnosis present

## 2021-01-14 DIAGNOSIS — Z6841 Body Mass Index (BMI) 40.0 and over, adult: Secondary | ICD-10-CM

## 2021-01-14 DIAGNOSIS — K31819 Angiodysplasia of stomach and duodenum without bleeding: Secondary | ICD-10-CM | POA: Diagnosis present

## 2021-01-14 DIAGNOSIS — K8050 Calculus of bile duct without cholangitis or cholecystitis without obstruction: Secondary | ICD-10-CM | POA: Diagnosis present

## 2021-01-14 DIAGNOSIS — K8071 Calculus of gallbladder and bile duct without cholecystitis with obstruction: Secondary | ICD-10-CM

## 2021-01-14 DIAGNOSIS — K8061 Calculus of gallbladder and bile duct with cholecystitis, unspecified, with obstruction: Secondary | ICD-10-CM | POA: Diagnosis not present

## 2021-01-14 DIAGNOSIS — R7989 Other specified abnormal findings of blood chemistry: Secondary | ICD-10-CM | POA: Diagnosis present

## 2021-01-14 DIAGNOSIS — Z9889 Other specified postprocedural states: Secondary | ICD-10-CM | POA: Diagnosis not present

## 2021-01-14 DIAGNOSIS — F32A Depression, unspecified: Secondary | ICD-10-CM | POA: Diagnosis present

## 2021-01-14 DIAGNOSIS — Z9109 Other allergy status, other than to drugs and biological substances: Secondary | ICD-10-CM | POA: Diagnosis not present

## 2021-01-14 DIAGNOSIS — K8064 Calculus of gallbladder and bile duct with chronic cholecystitis without obstruction: Principal | ICD-10-CM | POA: Diagnosis present

## 2021-01-14 DIAGNOSIS — Z885 Allergy status to narcotic agent status: Secondary | ICD-10-CM

## 2021-01-14 DIAGNOSIS — Z87891 Personal history of nicotine dependence: Secondary | ICD-10-CM | POA: Diagnosis not present

## 2021-01-14 DIAGNOSIS — K8020 Calculus of gallbladder without cholecystitis without obstruction: Secondary | ICD-10-CM | POA: Diagnosis present

## 2021-01-14 DIAGNOSIS — R1010 Upper abdominal pain, unspecified: Secondary | ICD-10-CM | POA: Insufficient documentation

## 2021-01-14 DIAGNOSIS — K219 Gastro-esophageal reflux disease without esophagitis: Secondary | ICD-10-CM | POA: Diagnosis present

## 2021-01-14 DIAGNOSIS — K838 Other specified diseases of biliary tract: Secondary | ICD-10-CM | POA: Diagnosis not present

## 2021-01-14 DIAGNOSIS — Z8249 Family history of ischemic heart disease and other diseases of the circulatory system: Secondary | ICD-10-CM | POA: Diagnosis not present

## 2021-01-14 DIAGNOSIS — R52 Pain, unspecified: Secondary | ICD-10-CM

## 2021-01-14 LAB — CBC WITH DIFFERENTIAL/PLATELET
Abs Immature Granulocytes: 0.02 10*3/uL (ref 0.00–0.07)
Basophils Absolute: 0 10*3/uL (ref 0.0–0.1)
Basophils Relative: 1 %
Eosinophils Absolute: 0.1 10*3/uL (ref 0.0–0.5)
Eosinophils Relative: 2 %
HCT: 38.7 % (ref 36.0–46.0)
Hemoglobin: 12.9 g/dL (ref 12.0–15.0)
Immature Granulocytes: 0 %
Lymphocytes Relative: 28 %
Lymphs Abs: 1.7 10*3/uL (ref 0.7–4.0)
MCH: 30.1 pg (ref 26.0–34.0)
MCHC: 33.3 g/dL (ref 30.0–36.0)
MCV: 90.4 fL (ref 80.0–100.0)
Monocytes Absolute: 0.5 10*3/uL (ref 0.1–1.0)
Monocytes Relative: 8 %
Neutro Abs: 3.7 10*3/uL (ref 1.7–7.7)
Neutrophils Relative %: 61 %
Platelets: 335 10*3/uL (ref 150–400)
RBC: 4.28 MIL/uL (ref 3.87–5.11)
RDW: 12.9 % (ref 11.5–15.5)
WBC: 6.1 10*3/uL (ref 4.0–10.5)
nRBC: 0 % (ref 0.0–0.2)

## 2021-01-14 LAB — LIPASE, BLOOD: Lipase: 21 U/L (ref 11–51)

## 2021-01-14 LAB — COMPREHENSIVE METABOLIC PANEL
ALT: 137 U/L — ABNORMAL HIGH (ref 0–44)
AST: 166 U/L — ABNORMAL HIGH (ref 15–41)
Albumin: 3.5 g/dL (ref 3.5–5.0)
Alkaline Phosphatase: 454 U/L — ABNORMAL HIGH (ref 38–126)
Anion gap: 9 (ref 5–15)
BUN: 9 mg/dL (ref 6–20)
CO2: 23 mmol/L (ref 22–32)
Calcium: 9 mg/dL (ref 8.9–10.3)
Chloride: 101 mmol/L (ref 98–111)
Creatinine, Ser: 0.73 mg/dL (ref 0.44–1.00)
GFR, Estimated: >60 mL/min (ref >60)
Glucose, Bld: 98 mg/dL (ref 70–99)
Potassium: 3.9 mmol/L (ref 3.5–5.1)
Sodium: 133 mmol/L — ABNORMAL LOW (ref 135–145)
Total Bilirubin: 1.6 mg/dL — ABNORMAL HIGH (ref 0.3–1.2)
Total Protein: 7.1 g/dL (ref 6.5–8.1)

## 2021-01-14 LAB — URINALYSIS, ROUTINE W REFLEX MICROSCOPIC
Bilirubin Urine: NEGATIVE
Glucose, UA: NEGATIVE mg/dL
Ketones, ur: NEGATIVE mg/dL
Leukocytes,Ua: NEGATIVE
Nitrite: NEGATIVE
Protein, ur: NEGATIVE mg/dL
Specific Gravity, Urine: 1.019 (ref 1.005–1.030)
pH: 5 (ref 5.0–8.0)

## 2021-01-14 LAB — RESP PANEL BY RT-PCR (FLU A&B, COVID) ARPGX2
Influenza A by PCR: NEGATIVE
Influenza B by PCR: NEGATIVE
SARS Coronavirus 2 by RT PCR: NEGATIVE

## 2021-01-14 LAB — PREGNANCY, URINE: Preg Test, Ur: NEGATIVE

## 2021-01-14 MED ORDER — HYDROMORPHONE HCL 1 MG/ML IJ SOLN
1.0000 mg | INTRAMUSCULAR | Status: DC | PRN
  Administered 2021-01-14 (×2): 1 mg via INTRAVENOUS
  Filled 2021-01-14 (×2): qty 1

## 2021-01-14 MED ORDER — DIPHENHYDRAMINE HCL 25 MG PO CAPS
25.0000 mg | ORAL_CAPSULE | ORAL | Status: DC | PRN
  Administered 2021-01-14 – 2021-01-17 (×4): 25 mg via ORAL
  Filled 2021-01-14 (×5): qty 1

## 2021-01-14 MED ORDER — SODIUM CHLORIDE 0.9 % IV SOLN: INTRAVENOUS | Status: DC

## 2021-01-14 MED ORDER — ACETAMINOPHEN 325 MG PO TABS: 650.0000 mg | ORAL_TABLET | Freq: Four times a day (QID) | ORAL | Status: DC | PRN

## 2021-01-14 MED ORDER — DIAZEPAM 5 MG/ML IJ SOLN
5.0000 mg | Freq: Once | INTRAMUSCULAR | Status: AC | PRN
  Administered 2021-01-14: 5 mg via INTRAVENOUS
  Filled 2021-01-14: qty 2

## 2021-01-14 MED ORDER — SODIUM CHLORIDE 0.9 % IV BOLUS
1000.0000 mL | Freq: Once | INTRAVENOUS | Status: AC
  Administered 2021-01-14: 1000 mL via INTRAVENOUS

## 2021-01-14 MED ORDER — GADOBUTROL 1 MMOL/ML IV SOLN
10.0000 mL | Freq: Once | INTRAVENOUS | Status: AC | PRN
  Administered 2021-01-14: 10 mL via INTRAVENOUS

## 2021-01-14 MED ORDER — ACETAMINOPHEN 650 MG RE SUPP: 650.0000 mg | Freq: Four times a day (QID) | RECTAL | Status: DC | PRN

## 2021-01-14 MED ORDER — PANTOPRAZOLE SODIUM 40 MG IV SOLR
40.0000 mg | INTRAVENOUS | Status: DC
  Administered 2021-01-14 – 2021-01-16 (×3): 40 mg via INTRAVENOUS
  Filled 2021-01-14 (×3): qty 40

## 2021-01-14 MED ORDER — ONDANSETRON HCL 4 MG PO TABS: 4.0000 mg | ORAL_TABLET | Freq: Four times a day (QID) | ORAL | Status: DC | PRN

## 2021-01-14 MED ORDER — POTASSIUM CHLORIDE IN NACL 20-0.9 MEQ/L-% IV SOLN: INTRAVENOUS | Status: DC

## 2021-01-14 MED ORDER — ONDANSETRON HCL 4 MG/2ML IJ SOLN: 4.0000 mg | Freq: Four times a day (QID) | INTRAMUSCULAR | Status: DC | PRN

## 2021-01-14 MED ORDER — HEPARIN SODIUM (PORCINE) 5000 UNIT/ML IJ SOLN
5000.0000 [IU] | Freq: Three times a day (TID) | INTRAMUSCULAR | Status: DC
  Administered 2021-01-14 – 2021-01-15 (×3): 5000 [IU] via SUBCUTANEOUS
  Filled 2021-01-14 (×2): qty 1

## 2021-01-14 NOTE — ED Triage Notes (Signed)
Pt has abdominal pain that has been intermittent since Thursday.

## 2021-01-14 NOTE — ED Notes (Signed)
Attempting to urinate

## 2021-01-14 NOTE — ED Notes (Signed)
Patient transported to Ultrasound 

## 2021-01-14 NOTE — Telephone Encounter (Signed)
Called pt to reiterate no availability in appts, suggested to got to [facility] or ER

## 2021-01-14 NOTE — ED Provider Notes (Signed)
[facility] EMERGENCY DEPARTMENT Provider Note   CSN: [id number] Arrival date & time: 01/14/21  8413     History Chief Complaint  Patient presents with   Abdominal Pain    Tiffany Chambers is a 44 y.o. female.  HPI 45 year old female presents with upper abdominal pain. Is mostly RUQ. Started on 7/7. Feels just like the pain she had before she was diagnosed with a large hiatal hernia that required operative repair.  Has had no pain since the operation until then.  Vomited once with some blood-tinged emesis.  Since then the pain has remained dull and constant.  On 7/10 she had another attack that was severe.  Since then the pain has gone back to a 3 out of 10.  No chest pain, fevers.  Has not been eating much due to the discomfort and so she is not having bowel movements and feels dizzy.  Past Medical History:  Diagnosis Date   Arthritis    back, hands   Depression    GERD (gastroesophageal reflux disease)    Headache    not since1/2021   Reflux     Patient Active Problem List   Diagnosis Date Noted   Choledocholithiasis 01/14/2021   S/P robot-assisted surgical procedure 12/09/2020   Hiatal hernia with GERD 12/07/2020   Mammographic breast lesion 08/20/2020   Heel spur, right    Morbid obesity (HCC) 07/12/2020   Plantar fasciitis 07/12/2020   Alopecia 10/10/2019   Menorrhagia 10/10/2019   GERD (gastroesophageal reflux disease) 08/27/2016   Depression 08/27/2016    Past Surgical History:  Procedure Laterality Date   CESAREAN SECTION     x 3   ESOPHAGOGASTRODUODENOSCOPY N/A 12/09/2020   Procedure: ESOPHAGOGASTRODUODENOSCOPY (EGD);  Surgeon: Corliss Skains, MD;  Location: [facility] OR;  Service: Thoracic;  Laterality: N/A;   HEEL SPUR RESECTION Right 08/14/2020   Procedure: POSSIBLE HEEL SPUR RESECTION;  Surgeon: Park Liter, DPM;  Location: [facility] ORS;  Service: Podiatry;  Laterality: Right;   HERNIA REPAIR     PLANTAR FASCIA RELEASE Right 08/14/2020   Procedure:  ENDOSCOPIC PLANTAR FASCIOTOMY;  Surgeon: Park Liter, DPM;  Location: [facility] ORS;  Service: Podiatry;  Laterality: Right;   XI ROBOTIC ASSISTED HIATAL HERNIA REPAIR N/A 12/09/2020   Procedure: XI ROBOTIC ASSISTED HIATAL HERNIA REPAIR;  Surgeon: Corliss Skains, MD;  Location: [facility] OR;  Service: Thoracic;  Laterality: N/A;  EGD required     OB History   No obstetric history on file.     Family History  Problem Relation Age of Onset   Congestive Heart Failure Mother        NO MI, CAD, CABG per pt    Social History   Tobacco Use   Smoking status: Former    Years: 15.00    Pack years: 0.00    Types: Cigarettes   Smokeless tobacco: Never   Tobacco comments:    1 pack lasts one month or more  Vaping Use   Vaping Use: Never used  Substance Use Topics   Alcohol use: Yes    Comment: social    Drug use: No    Home Medications Prior to Admission medications   Medication Sig Start Date End Date Taking? Authorizing Provider  citalopram (CELEXA) 20 MG tablet Take 20 mg by mouth daily. 12/11/20  Yes [provider]  esomeprazole (NEXIUM) 20 MG capsule Take 1 capsule (20 mg total) by mouth daily. You can open the capsule and sprinkle the  contents into apple sauce 12/11/20  Yes Conte, Tessa N, PA-C  OVER THE COUNTER MEDICATION Take 1 tablet by mouth daily. giambi   Yes [provider]  citalopram (CELEXA) 10 MG/5ML suspension Take 10 mLs (20 mg total) by mouth daily. Patient not taking: No sig reported 12/11/20   Sharlene Doryonte, Tessa N, PA-C  diphenhydramine-acetaminophen (TYLENOL PM) 25-500 MG TABS tablet Take 1 tablet by mouth at bedtime as needed.    [provider]  HYDROcodone-acetaminophen (HYCET) 7.5-325 mg/15 ml solution Take 10 mLs by mouth every 6 (six) hours as needed for moderate pain. Patient not taking: Reported on 01/14/2021 12/11/20   Sharlene Doryonte, Tessa N, PA-C  neomycin-bacitracin-polymyxin (NEOSPORIN) ointment Apply 1 application topically as needed for wound  care. Patient not taking: Reported on 01/14/2021    [provider]    Allergies    Iodine and Morphine and related  Review of Systems   Review of Systems  Constitutional:  Negative for fever.  Respiratory:  Negative for shortness of breath.   Cardiovascular:  Negative for chest pain.  Gastrointestinal:  Positive for abdominal pain, constipation and vomiting. Negative for diarrhea.  All other systems reviewed and are negative.  Physical Exam Updated Vital Signs BP (!) 141/79   Pulse 74   Temp 98.3 F (36.8 C) (Oral)   Resp 12   Ht 5\' 4"  (1.626 m)   Wt 136.1 kg   SpO2 98%   BMI 51.49 kg/m   Physical Exam Vitals and nursing note reviewed.  Constitutional:      General: She is not in acute distress.    Appearance: She is well-developed. She is obese. She is not ill-appearing or diaphoretic.  HENT:     Head: Normocephalic and atraumatic.     Right Ear: External ear normal.     Left Ear: External ear normal.     Nose: Nose normal.  Eyes:     General:        Right eye: No discharge.        Left eye: No discharge.  Cardiovascular:     Rate and Rhythm: Normal rate and regular rhythm.     Heart sounds: Normal heart sounds.  Pulmonary:     Effort: Pulmonary effort is normal.     Breath sounds: Normal breath sounds.  Abdominal:     Palpations: Abdomen is soft.     Tenderness: There is abdominal tenderness in the right upper quadrant and epigastric area.  Skin:    General: Skin is warm and dry.  Neurological:     Mental Status: She is alert.  Psychiatric:        Mood and Affect: Mood is not anxious.    ED Results / Procedures / Treatments   Labs (all labs ordered are listed, but only abnormal results are displayed) Labs Reviewed  COMPREHENSIVE METABOLIC PANEL - Abnormal; Notable for the following components:      Result Value   Sodium 133 (*)    AST 166 (*)    ALT 137 (*)    Alkaline Phosphatase 454 (*)    Total Bilirubin 1.6 (*)    All other  components within normal limits  URINALYSIS, ROUTINE W REFLEX MICROSCOPIC - Abnormal; Notable for the following components:   Color, Urine AMBER (*)    APPearance HAZY (*)    Hgb urine dipstick SMALL (*)    Bacteria, UA RARE (*)    All other components within normal limits  RESP PANEL BY RT-PCR (FLU  A&B, COVID) ARPGX2  LIPASE, BLOOD  CBC WITH DIFFERENTIAL/PLATELET  PREGNANCY, URINE    EKG None  Radiology MR 3D Recon At Scanner  Result Date: 01/14/2021 CLINICAL DATA:  Intermittent abdominal pain since last Thursday. Cholelithiasis seen on recent ultrasound. EXAM: MRI ABDOMEN WITHOUT AND WITH CONTRAST (INCLUDING MRCP) TECHNIQUE: Multiplanar multisequence MR imaging of the abdomen was performed both before and after the administration of intravenous contrast. Heavily T2-weighted images of the biliary and pancreatic ducts were obtained, and three-dimensional MRCP images were rendered by post processing. CONTRAST:  95mL GADAVIST GADOBUTROL 1 MMOL/ML IV SOLN COMPARISON:  Abdominal CT scan and 01/10/2021 and abdominal ultrasound 01/14/2021 FINDINGS: Lower chest: The lung bases are grossly clear. No pulmonary lesions or pleural effusions. No pericardial effusion. Hepatobiliary: No hepatic lesions or intrahepatic biliary dilatation. The gallbladder has innumerable small layering gallstones. No gallbladder wall thickening or pericholecystic fluid or pericholecystic inflammatory changes to suggest acute cholecystitis. There is common bile duct dilatation as noted on the ultrasound. In the porta hepatis and measures 10 mm and in the head of the pancreas and measures a maximum 9.5 mm. It does taper normally toward the ampulla. There are at least 5 small calculi in the distal common bile duct collectively likely causing obstruction. Pancreas:  No mass, inflammation or ductal dilatation. Spleen:  Normal size.  No focal lesions. Adrenals/Urinary Tract:  The adrenal glands and kidneys are normal. Stomach/Bowel:  The stomach, duodenum, visualized small bowel and visualized colon are grossly normal. Vascular/Lymphatic: The aorta and branch vessels are patent. The major venous structures are patent. No mesenteric or retroperitoneal mass or adenopathy. Other:  No ascites or abdominal hernia. Musculoskeletal: No significant bony findings. IMPRESSION: 1. Cholelithiasis but no MR findings to suggest acute cholecystitis. 2. Dilated common bile duct and choledocholithiasis with at least 5 small calculi in the distal common bile duct. 3. No other significant abdominal findings, mass lesions or adenopathy. Electronically Signed   By: Rudie Meyer M.D.   On: 01/14/2021 15:28   MR ABDOMEN MRCP W WO CONTAST  Result Date: 01/14/2021 CLINICAL DATA:  Intermittent abdominal pain since last Thursday. Cholelithiasis seen on recent ultrasound. EXAM: MRI ABDOMEN WITHOUT AND WITH CONTRAST (INCLUDING MRCP) TECHNIQUE: Multiplanar multisequence MR imaging of the abdomen was performed both before and after the administration of intravenous contrast. Heavily T2-weighted images of the biliary and pancreatic ducts were obtained, and three-dimensional MRCP images were rendered by post processing. CONTRAST:  68mL GADAVIST GADOBUTROL 1 MMOL/ML IV SOLN COMPARISON:  Abdominal CT scan and 01/10/2021 and abdominal ultrasound 01/14/2021 FINDINGS: Lower chest: The lung bases are grossly clear. No pulmonary lesions or pleural effusions. No pericardial effusion. Hepatobiliary: No hepatic lesions or intrahepatic biliary dilatation. The gallbladder has innumerable small layering gallstones. No gallbladder wall thickening or pericholecystic fluid or pericholecystic inflammatory changes to suggest acute cholecystitis. There is common bile duct dilatation as noted on the ultrasound. In the porta hepatis and measures 10 mm and in the head of the pancreas and measures a maximum 9.5 mm. It does taper normally toward the ampulla. There are at least 5 small calculi in  the distal common bile duct collectively likely causing obstruction. Pancreas:  No mass, inflammation or ductal dilatation. Spleen:  Normal size.  No focal lesions. Adrenals/Urinary Tract:  The adrenal glands and kidneys are normal. Stomach/Bowel: The stomach, duodenum, visualized small bowel and visualized colon are grossly normal. Vascular/Lymphatic: The aorta and branch vessels are patent. The major venous structures are patent. No mesenteric or retroperitoneal mass or  adenopathy. Other:  No ascites or abdominal hernia. Musculoskeletal: No significant bony findings. IMPRESSION: 1. Cholelithiasis but no MR findings to suggest acute cholecystitis. 2. Dilated common bile duct and choledocholithiasis with at least 5 small calculi in the distal common bile duct. 3. No other significant abdominal findings, mass lesions or adenopathy. Electronically Signed   By: Rudie Meyer M.D.   On: 01/14/2021 15:28   US Abdomen Limited RUQ (LIVER/GB)  Result Date: 01/14/2021 CLINICAL DATA:  45 year old female with 5 days of upper abdominal pain. EXAM: ULTRASOUND ABDOMEN LIMITED RIGHT UPPER QUADRANT COMPARISON:  CT Abdomen and Pelvis 01/10/2021. FINDINGS: Gallbladder: Evidence of small shadowing gallstones (images 8 and 39), individually estimated up to 12 mm. Probable superimposed sludge. Gallbladder wall thickness remains normal at 2-3 mm. No sonographic Murphy sign elicited. No pericholecystic fluid. Common bile duct: Diameter: 11-12 mm, dilated and increased from the recent CT (5-6 mm at that time). No filling defect in the visible duct (image 52). Liver: Echogenic liver (image 54). No discrete liver lesion. No definite intrahepatic ductal dilatation. Portal vein is patent on color Doppler imaging with normal direction of blood flow towards the liver. Other: Negative visible right kidney. IMPRESSION: 1. Dilated CBD (11 mm), new since the CT 4 days ago and highly suspicious for Choledocholithiasis in the setting of small  gallstones (#2). 2. Cholelithiasis and sludge but no evidence of acute cholecystitis. 3. Hepatic steatosis. Electronically Signed   By: Odessa Fleming M.D.   On: 01/14/2021 09:07    Procedures Procedures   Medications Ordered in ED Medications  sodium chloride 0.9 % bolus 1,000 mL (0 mLs Intravenous Stopped 01/14/21 1041)  diazepam (VALIUM) injection 5 mg (5 mg Intravenous Given 01/14/21 1326)  gadobutrol (GADAVIST) 1 MMOL/ML injection 10 mL (10 mLs Intravenous Contrast Given 01/14/21 1247)    ED Course  I have reviewed the triage vital signs and the nursing notes.  Pertinent labs & imaging results that were available during my care of the patient were reviewed by me and considered in my medical decision making (see chart for details).    MDM Rules/Calculators/A&P                          I discussed with Dr. Christel Mormon, who recommends MRCP and re-consult. MRCP shows some small stones in ducts, and in combination with her abnormal LFTs she will need ERCP. NPO after midnight. D/w Dr. Kerry Hough who will admit. No indication of acute infection. Final Clinical Impression(s) / ED Diagnoses Final diagnoses:  Upper abdominal pain  Choledocholithiasis    Rx / DC Orders ED Discharge Orders     None        Pricilla Loveless, MD 01/14/21 1542

## 2021-01-14 NOTE — H&P (Signed)
History and Physical    Tiffany Chambers ZOX:[id number] DOB: 1975-08-26 DOA: 01/14/2021  PCP: Donita BrooksPickard, Warren T, MD  Patient coming from: Home  I have personally briefly reviewed patient[facility]  Chief Complaint: Right upper quadrant abdominal pain  HPI: Tiffany Chambers is a 45 y.o. female with medical history significant of GERD, recent surgery for large hiatal hernia in 12/2020, class III obesity, depression, presents to the hospital with complaints of right upper quadrant abdominal pain.  Reports onset of symptoms for the last 5 days.  She has had nausea, but no vomiting.  She has not had any fever.  Pain starts in the right upper quadrant and does radiate to the epigastric area.  She has not had any diarrhea, melena or hematochezia.  No dysuria.  ED Course: On arrival to the emergency room, overall vitals noted to be stable.  She was noted to have elevated transaminases as well as bilirubin.  WBC count normal.  Imaging of the right upper quadrant showed dilated bile ducts.  MRCP confirmed cholelithiasis as well as choledocholithiasis.  No evidence of cholecystitis per MRI.  She has been referred for admission.  Review of Systems: Review of Systems  Constitutional:  Negative for chills and fever.  HENT:  Negative for congestion and sore throat.   Eyes:  Negative for blurred vision and double vision.  Respiratory:  Negative for cough, shortness of breath and wheezing.   Cardiovascular:  Negative for chest pain and palpitations.  Gastrointestinal:  Positive for abdominal pain and nausea. Negative for blood in stool, constipation and diarrhea.  Genitourinary:  Negative for dysuria and frequency.  Neurological:  Negative for dizziness, loss of consciousness and headaches.  Psychiatric/Behavioral:  Positive for depression.      Past Medical History:  Diagnosis Date   Arthritis    back, hands   Depression    GERD (gastroesophageal reflux disease)     Headache    not since1/2021   Reflux     Past Surgical History:  Procedure Laterality Date   CESAREAN SECTION     x 3   ESOPHAGOGASTRODUODENOSCOPY N/A 12/09/2020   Procedure: ESOPHAGOGASTRODUODENOSCOPY (EGD);  Surgeon: Corliss SkainsLightfoot, Harrell O, MD;  [facility];  Service: Thoracic;  Laterality: N/A;   HEEL SPUR RESECTION Right 08/14/2020   Procedure: POSSIBLE HEEL SPUR RESECTION;  Surgeon: Park LiterPrice, Michael J, DPM;  Location: [facility] ORS;  Service: Podiatry;  Laterality: Right;   HERNIA REPAIR     PLANTAR FASCIA RELEASE Right 08/14/2020   Procedure: ENDOSCOPIC PLANTAR FASCIOTOMY;  Surgeon: Park LiterPrice, Michael J, DPM;  Location: [facility] ORS;  Service: Podiatry;  Laterality: Right;   XI ROBOTIC ASSISTED HIATAL HERNIA REPAIR N/A 12/09/2020   Procedure: XI ROBOTIC ASSISTED HIATAL HERNIA REPAIR;  Surgeon: Corliss SkainsLightfoot, Harrell O, MD;  Location: [facility] OR;  Service: Thoracic;  Laterality: N/A;  EGD required    Social History:  reports that she has quit smoking. Her smoking use included cigarettes. She has never used smokeless tobacco. She reports current alcohol use. She reports that she does not use drugs.  Allergies  Allergen Reactions   Iodine Itching   Morphine And Related Itching    Family History  Problem Relation Age of Onset   Congestive Heart Failure Mother        NO MI, CAD, CABG per pt    Prior to Admission medications   Medication Sig Start Date End Date Taking? Authorizing Provider  citalopram (CELEXA) 20 MG tablet Take  20 mg by mouth daily. 12/11/20  Yes [provider]  esomeprazole (NEXIUM) 20 MG capsule Take 1 capsule (20 mg total) by mouth daily. You can open the capsule and sprinkle the contents into apple sauce 12/11/20  Yes Conte, Tessa N, PA-C  OVER THE COUNTER MEDICATION Take 1 tablet by mouth daily. giambi   Yes [provider]  citalopram (CELEXA) 10 MG/5ML suspension Take 10 mLs (20 mg total) by mouth daily. Patient not taking: No sig reported 12/11/20   [name], PA-C  diphenhydramine-acetaminophen (TYLENOL PM) 25-500 MG TABS tablet Take 1 tablet by mouth at bedtime as needed.    [provider]  HYDROcodone-acetaminophen (HYCET) 7.5-325 mg/15 ml solution Take 10 mLs by mouth every 6 (six) hours as needed for moderate pain. Patient not taking: Reported on 01/14/2021 12/11/20   [name], PA-C  neomycin-bacitracin-polymyxin (NEOSPORIN) ointment Apply 1 application topically as needed for wound care. Patient not taking: Reported on 01/14/2021    [provider]    Physical Exam: Vitals:   01/14/21 1630 01/14/21 1700 01/14/21 1730 01/14/21 1755  BP: 128/77 117/80 125/69 127/82  Pulse: 74 82 77 82  Resp: (!) 9 14 16 20   Temp:    98.7 F (37.1 C)  TempSrc:    Oral  SpO2: 98% 97% 99% 100%  Weight:    134.1 kg  Height:    5\' 4"  (1.626 m)    Constitutional: NAD, calm, comfortable Eyes: PERRL, lids and conjunctivae normal ENMT: Mucous membranes are moist. Posterior pharynx clear of any exudate or lesions.Normal dentition.  Neck: normal, supple, no masses, no thyromegaly Respiratory: clear to auscultation bilaterally, no wheezing, no crackles. Normal respiratory effort. No accessory muscle use.  Cardiovascular: Regular rate and rhythm, no murmurs / rubs / gallops. No extremity edema. 2+ pedal pulses. No carotid bruits.  Abdomen: Tender in right upper quadrant, no masses palpated. No hepatosplenomegaly. Bowel sounds positive.  Musculoskeletal: no clubbing / cyanosis. No joint deformity upper and lower extremities. Good ROM, no contractures. Normal muscle tone.  Skin: no rashes, lesions, ulcers. No induration Neurologic: CN 2-12 grossly intact. Sensation intact, DTR normal. Strength 5/5 in all 4.  Psychiatric: Normal judgment and insight. Alert and oriented x 3. Normal mood.    Labs on Admission: I have personally reviewed following labs and imaging studies  CBC: Recent Labs  Lab 01/09/21 1536 01/14/21 0901  WBC 9.4  6.1  NEUTROABS 6.3 3.7  HGB 12.1 12.9  HCT 35.5* 38.7  MCV 88.5 90.4  PLT 333 335   Basic Metabolic Panel: Recent Labs  Lab 01/09/21 1536 01/14/21 0901  NA 134* 133*  K 4.3 3.9  CL 103 101  CO2 21* 23  GLUCOSE 101* 98  BUN 10 9  CREATININE 0.81 0.73  CALCIUM 9.0 9.0   GFR: Estimated Creatinine Clearance: 120 mL/min (by C-G formula based on SCr of 0.73 mg/dL). Liver Function Tests: Recent Labs  Lab 01/09/21 1536 01/14/21 0901  AST 37 166*  ALT 11 137*  ALKPHOS 98 454*  BILITOT 1.3* 1.6*  PROT 6.7 7.1  ALBUMIN 3.3* 3.5   Recent Labs  Lab 01/09/21 1536 01/14/21 0901  LIPASE 27 21   No results for input(s): AMMONIA in the last 168 hours. Coagulation Profile: No results for input(s): INR, PROTIME in the last 168 hours. Cardiac Enzymes: No results for input(s): CKTOTAL, CKMB, CKMBINDEX, TROPONINI in the last 168 hours. BNP (last 3 results) No results for input(s): PROBNP in the  last 8760 hours. HbA1C: No results for input(s): HGBA1C in the last 72 hours. CBG: No results for input(s): GLUCAP in the last 168 hours. Lipid Profile: No results for input(s): CHOL, HDL, LDLCALC, TRIG, CHOLHDL, LDLDIRECT in the last 72 hours. Thyroid Function Tests: No results for input(s): TSH, T4TOTAL, FREET4, T3FREE, THYROIDAB in the last 72 hours. Anemia Panel: No results for input(s): VITAMINB12, FOLATE, FERRITIN, TIBC, IRON, RETICCTPCT in the last 72 hours. Urine analysis:    Component Value Date/Time   COLORURINE AMBER (A) 01/14/2021 0758   APPEARANCEUR HAZY (A) 01/14/2021 0758   LABSPEC 1.019 01/14/2021 0758   PHURINE 5.0 01/14/2021 0758   GLUCOSEU NEGATIVE 01/14/2021 0758   HGBUR SMALL (A) 01/14/2021 0758   BILIRUBINUR NEGATIVE 01/14/2021 0758   KETONESUR NEGATIVE 01/14/2021 0758   PROTEINUR NEGATIVE 01/14/2021 0758   NITRITE NEGATIVE 01/14/2021 0758   LEUKOCYTESUR NEGATIVE 01/14/2021 0758    Radiological Exams on Admission: MR 3D Recon At Scanner  Result Date:  01/14/2021 CLINICAL DATA:  Intermittent abdominal pain since last Thursday. Cholelithiasis seen on recent ultrasound. EXAM: MRI ABDOMEN WITHOUT AND WITH CONTRAST (INCLUDING MRCP) TECHNIQUE: Multiplanar multisequence MR imaging of the abdomen was performed both before and after the administration of intravenous contrast. Heavily T2-weighted images of the biliary and pancreatic ducts were obtained, and three-dimensional MRCP images were rendered by post processing. CONTRAST:  70mL GADAVIST GADOBUTROL 1 MMOL/ML IV SOLN COMPARISON:  Abdominal CT scan and 01/10/2021 and abdominal ultrasound 01/14/2021 FINDINGS: Lower chest: The lung bases are grossly clear. No pulmonary lesions or pleural effusions. No pericardial effusion. Hepatobiliary: No hepatic lesions or intrahepatic biliary dilatation. The gallbladder has innumerable small layering gallstones. No gallbladder wall thickening or pericholecystic fluid or pericholecystic inflammatory changes to suggest acute cholecystitis. There is common bile duct dilatation as noted on the ultrasound. In the porta hepatis and measures 10 mm and in the head of the pancreas and measures a maximum 9.5 mm. It does taper normally toward the ampulla. There are at least 5 small calculi in the distal common bile duct collectively likely causing obstruction. Pancreas:  No mass, inflammation or ductal dilatation. Spleen:  Normal size.  No focal lesions. Adrenals/Urinary Tract:  The adrenal glands and kidneys are normal. Stomach/Bowel: The stomach, duodenum, visualized small bowel and visualized colon are grossly normal. Vascular/Lymphatic: The aorta and branch vessels are patent. The major venous structures are patent. No mesenteric or retroperitoneal mass or adenopathy. Other:  No ascites or abdominal hernia. Musculoskeletal: No significant bony findings. IMPRESSION: 1. Cholelithiasis but no MR findings to suggest acute cholecystitis. 2. Dilated common bile duct and choledocholithiasis with  at least 5 small calculi in the distal common bile duct. 3. No other significant abdominal findings, mass lesions or adenopathy. Electronically Signed   By: Rudie Meyer M.D.   On: 01/14/2021 15:28   MR ABDOMEN MRCP W WO CONTAST  Result Date: 01/14/2021 CLINICAL DATA:  Intermittent abdominal pain since last Thursday. Cholelithiasis seen on recent ultrasound. EXAM: MRI ABDOMEN WITHOUT AND WITH CONTRAST (INCLUDING MRCP) TECHNIQUE: Multiplanar multisequence MR imaging of the abdomen was performed both before and after the administration of intravenous contrast. Heavily T2-weighted images of the biliary and pancreatic ducts were obtained, and three-dimensional MRCP images were rendered by post processing. CONTRAST:  15mL GADAVIST GADOBUTROL 1 MMOL/ML IV SOLN COMPARISON:  Abdominal CT scan and 01/10/2021 and abdominal ultrasound 01/14/2021 FINDINGS: Lower chest: The lung bases are grossly clear. No pulmonary lesions or pleural effusions. No pericardial effusion. Hepatobiliary: No hepatic lesions or  intrahepatic biliary dilatation. The gallbladder has innumerable small layering gallstones. No gallbladder wall thickening or pericholecystic fluid or pericholecystic inflammatory changes to suggest acute cholecystitis. There is common bile duct dilatation as noted on the ultrasound. In the porta hepatis and measures 10 mm and in the head of the pancreas and measures a maximum 9.5 mm. It does taper normally toward the ampulla. There are at least 5 small calculi in the distal common bile duct collectively likely causing obstruction. Pancreas:  No mass, inflammation or ductal dilatation. Spleen:  Normal size.  No focal lesions. Adrenals/Urinary Tract:  The adrenal glands and kidneys are normal. Stomach/Bowel: The stomach, duodenum, visualized small bowel and visualized colon are grossly normal. Vascular/Lymphatic: The aorta and branch vessels are patent. The major venous structures are patent. No mesenteric or  retroperitoneal mass or adenopathy. Other:  No ascites or abdominal hernia. Musculoskeletal: No significant bony findings. IMPRESSION: 1. Cholelithiasis but no MR findings to suggest acute cholecystitis. 2. Dilated common bile duct and choledocholithiasis with at least 5 small calculi in the distal common bile duct. 3. No other significant abdominal findings, mass lesions or adenopathy. Electronically Signed   By: Rudie Meyer M.D.   On: 01/14/2021 15:28   US Abdomen Limited RUQ (LIVER/GB)  Result Date: 01/14/2021 CLINICAL DATA:  45 year old female with 5 days of upper abdominal pain. EXAM: ULTRASOUND ABDOMEN LIMITED RIGHT UPPER QUADRANT COMPARISON:  CT Abdomen and Pelvis 01/10/2021. FINDINGS: Gallbladder: Evidence of small shadowing gallstones (images 8 and 39), individually estimated up to 12 mm. Probable superimposed sludge. Gallbladder wall thickness remains normal at 2-3 mm. No sonographic Murphy sign elicited. No pericholecystic fluid. Common bile duct: Diameter: 11-12 mm, dilated and increased from the recent CT (5-6 mm at that time). No filling defect in the visible duct (image 52). Liver: Echogenic liver (image 54). No discrete liver lesion. No definite intrahepatic ductal dilatation. Portal vein is patent on color Doppler imaging with normal direction of blood flow towards the liver. Other: Negative visible right kidney. IMPRESSION: 1. Dilated CBD (11 mm), new since the CT 4 days ago and highly suspicious for Choledocholithiasis in the setting of small gallstones (#2). 2. Cholelithiasis and sludge but no evidence of acute cholecystitis. 3. Hepatic steatosis. Electronically Signed   By: Odessa Fleming M.D.   On: 01/14/2021 09:07     Assessment/Plan Active Problems:   GERD (gastroesophageal reflux disease)   Depression   Morbid obesity (HCC)   Choledocholithiasis   Elevated LFTs   Cholelithiasis     Cholelithiasis and choledocholithiasis -No evidence of cholecystitis per imaging -LFTs are  elevated -GI consulted and plans on ERCP in a.m. -We will keep on clear liquids for now, n.p.o. after midnight -Continue supportive management with IV fluids, pain management -Per GI, no indication for antibiotics at this time -General surgery consulted for cholecystectomy  GERD -Continue on PPI  Depression -Chronically on Celexa -Can resume after procedures  DVT prophylaxis: heparin  Code Status: full code  Family Communication: discussed with husband at bedside  Disposition Plan: discharge home once work up is complete  Consults called: gastroenterology, general surgery  Admission status: inpatient, medsurg   Erick Blinks MD [facility]   If 7PM-7AM, please contact night-coverage [email or web address]   01/14/2021, 7:49 PM

## 2021-01-14 NOTE — Consult Note (Addendum)
Referring Provider: Dr. Regenia Skeeter Primary Care Physician:  Susy Frizzle, MD Primary Gastroenterologist:  Dr. Jenetta Downer, Previously unassigned  Date of Admission: 01/14/21 Date of Consultation: 01/14/21  Reason for Consultation:  Choledocolithiasis  HPI:  [name] is a pleasant 45 y.o. year old female with significant history of large hiatal hernia s/p robotic repair 12/09/20 by Dr. Kipp Brood, GERD and morbid obesity.   ED Course: Patient presented to the ED this morning with c/o upper abdominal pain, localized to RUQ. Stated that symptoms began initially on 7/7. She endorsed one episode of blood-tinged emesis at that time. Pain became dull and constant thereafter. On 7/10, she had worsening pain that has since decreased in severity to 3/10. She denied chest pain or fever. Reported she had not been eating much due to pain and also not having bowel movements. Vital signs stable in ED, patient afebrile.   LFTs elevated in ED. AST 166, ALT 137 and Alk phos 454, total bili 1.6, increased since 12/10/20, WNL at that time. Lipase normal today at 21. WBC 6.1.   Patient had [place] Abd RUQ while in ED that revealed dilated CBD (23m) new since abd CT 4 days ago, suspicious for Choledocolithiasis in the setting of gallstones. Cholelithiasis and sludge present without evidence of cholecystitis. Hepatic steatosis also present. She then underwent MRCP which again confirmed CBD dilation and choledocolithiasis with atleast 5 small calclui in the distal CBD. No other significant findings, masses or adenopathy noted.   GI Consult: Patient reports that she initially had an episode of severe RUQ pain 1 month ago. She came to the ED for evaluation and it was suspected that a large hiatal hernia was the source of her pain. Had robotic repair of [place] 12/09/20 and was doing well from that until this past Thursday when she had another similar episode of severe, RUQ pain with one episode of blood tinged emesis.  Reports thereafter, that pain lessened in nature to a dull constant pain, worse after eating. Also endorses postprandial nausea. She states she has chronic diarrhea but hast not had a BM since Thursday due to limiting her intake of foods because of the pain.   Last Colonoscopy: no prior Colonoscopies Last EGD: 5 years ago, unsure of GI MD who performed this. Had small hiatal hernia at that time. Familial GI History: Mother had IBD   Past Medical History:  Diagnosis Date   Arthritis    back, hands   Depression    GERD (gastroesophageal reflux disease)    Headache    not since1/2021   Reflux     Past Surgical History:  Procedure Laterality Date   CESAREAN SECTION     x 3   ESOPHAGOGASTRODUODENOSCOPY N/A 12/09/2020   Procedure: ESOPHAGOGASTRODUODENOSCOPY (EGD);  Surgeon: LLajuana Matte MD;  [facility] [facility]  Service: Thoracic;  Laterality: N/A;   HEEL SPUR RESECTION Right 08/14/2020   Procedure: POSSIBLE HEEL SPUR RESECTION;  Surgeon: PEvelina Bucy DPM;  Location: [facility];  Service: Podiatry;  Laterality: Right;   HERNIA REPAIR     PLANTAR FASCIA RELEASE Right 08/14/2020   Procedure: ENDOSCOPIC PLANTAR FASCIOTOMY;  Surgeon: PEvelina Bucy DPM;  Location: [facility];  Service: Podiatry;  Laterality: Right;   XI ROBOTIC ASSISTED HIATAL HERNIA REPAIR N/A 12/09/2020   Procedure: XI ROBOTIC ASSISTED HIATAL HERNIA REPAIR;  Surgeon: LLajuana Matte MD;  Location[facility]  Service: Thoracic;  Laterality: N/A;  EGD required    Prior to Admission  medications   Medication Sig Start Date End Date Taking? Authorizing Provider  citalopram (CELEXA) 20 MG tablet Take 20 mg by mouth daily. 12/11/20  Yes [provider]  esomeprazole (NEXIUM) 20 MG capsule Take 1 capsule (20 mg total) by mouth daily. You can open the capsule and sprinkle the contents into apple sauce 12/11/20  Yes Conte, Tessa N, PA-C  OVER THE COUNTER MEDICATION Take 1 tablet by mouth daily. giambi   Yes [provider]  citalopram (CELEXA) 10 MG/5ML suspension Take 10 mLs (20 mg total) by mouth daily. Patient not taking: No sig reported 12/11/20   Elgie Collard, PA-C  diphenhydramine-acetaminophen (TYLENOL PM) 25-500 MG TABS tablet Take 1 tablet by mouth at bedtime as needed.    [provider]  HYDROcodone-acetaminophen (HYCET) 7.5-325 mg/15 ml solution Take 10 mLs by mouth every 6 (six) hours as needed for moderate pain. Patient not taking: Reported on 01/14/2021 12/11/20   Elgie Collard, PA-C  neomycin-bacitracin-polymyxin (NEOSPORIN) ointment Apply 1 application topically as needed for wound care. Patient not taking: Reported on 01/14/2021    [provider]    No current facility-administered medications for this encounter.   Current Outpatient Medications  Medication Sig Dispense Refill   citalopram (CELEXA) 20 MG tablet Take 20 mg by mouth daily.     esomeprazole (NEXIUM) 20 MG capsule Take 1 capsule (20 mg total) by mouth daily. You can open the capsule and sprinkle the contents into apple sauce 30 capsule 1   OVER THE COUNTER MEDICATION Take 1 tablet by mouth daily. giambi     citalopram (CELEXA) 10 MG/5ML suspension Take 10 mLs (20 mg total) by mouth daily. (Patient not taking: No sig reported) 25 mL 12   diphenhydramine-acetaminophen (TYLENOL PM) 25-500 MG TABS tablet Take 1 tablet by mouth at bedtime as needed.     HYDROcodone-acetaminophen (HYCET) 7.5-325 mg/15 ml solution Take 10 mLs by mouth every 6 (six) hours as needed for moderate pain. (Patient not taking: Reported on 01/14/2021) 120 mL 0   neomycin-bacitracin-polymyxin (NEOSPORIN) ointment Apply 1 application topically as needed for wound care. (Patient not taking: Reported on 01/14/2021)      Allergies as of 01/14/2021 - Review Complete 01/14/2021  Allergen Reaction Noted   Iodine Itching 01/14/2021   Morphine and related Itching 05/24/2013    Family History  Problem Relation Age of Onset   Congestive  Heart Failure Mother        NO MI, CAD, CABG per pt    Social History   Socioeconomic History   Marital status: Married    Spouse name: Not on file   Number of children: Not on file   Years of education: Not on file   Highest education level: Not on file  Occupational History   Not on file  Tobacco Use   Smoking status: Former    Years: 15.00    Pack years: 0.00    Types: Cigarettes   Smokeless tobacco: Never   Tobacco comments:    1 pack lasts one month or more  Vaping Use   Vaping Use: Never used  Substance and Sexual Activity   Alcohol use: Yes    Comment: social    Drug use: No   Sexual activity: Not on file  Other Topics Concern   Not on file  Social History Narrative   Not on file   Social Determinants of Health   Financial Resource Strain: Not on file  Food Insecurity: Not  on file  Transportation Needs: Not on file  Physical Activity: Not on file  Stress: Not on file  Social Connections: Not on file  Intimate Partner Violence: Not on file    Review of Systems: Gen: Denies fever, chills, loss of appetite, change in weight or weight loss CV: Denies chest pain, heart palpitations, syncope, edema  Resp: Denies shortness of breath with rest, cough, wheezing GI: Denies dysphagia or odynophagia. Denies vomiting blood, jaundice, and fecal incontinence. Endorses RUQ pain  GU : Denies urinary burning, urinary frequency, urinary incontinence.  MS: Denies joint pain,swelling, cramping Derm: Denies rash, itching, dry skin Psych: Denies depression, anxiety,confusion, or memory loss Heme: Denies bruising, bleeding, and enlarged lymph nodes.  Physical Exam: Vital signs in last 24 hours: Temp:  [98.3 F (36.8 C)] 98.3 F (36.8 C) (07/12 0700) Pulse Rate:  [66-94] 74 (07/12 1508) Resp:  [10-18] 12 (07/12 1508) BP: (100-142)/(67-95) 141/79 (07/12 1507) SpO2:  [96 %-100 %] 98 % (07/12 1508) Weight:  [136.1 kg] 136.1 kg (07/12 0732)   General:   Alert,   Well-developed, well-nourished, pleasant and cooperative in NAD Head:  Normocephalic and atraumatic.. Lungs:  Clear throughout to auscultation.   No wheezes, crackles, or rhonchi. No acute distress. Heart:  Regular rate and rhythm; no murmurs, clicks, rubs,  or gallops. Abdomen:  Soft, nondistended. TTP RUQ. No masses, hepatosplenomegaly or hernias noted. Normal bowel sounds, without guarding, and without rebound.     Msk:  Symmetrical without gross deformities. Normal posture. Extremities:  Without clubbing or edema. Neurologic:  Alert and  oriented x4;  grossly normal neurologically. Skin:  Intact without significant lesions or rashes. Psych:  Alert and cooperative. Normal mood and affect.  Intake/Output from previous day: No intake/output data recorded. Intake/Output this shift: Total I/O In: 1581.8 [IV Piggyback:1581.8] Out: -   Lab Results: Recent Labs    01/14/21 0901  WBC 6.1  HGB 12.9  HCT 38.7  PLT 335   BMET Recent Labs    01/14/21 0901  NA 133*  K 3.9  CL 101  CO2 23  GLUCOSE 98  BUN 9  CREATININE 0.73  CALCIUM 9.0   LFT Recent Labs    01/14/21 0901  PROT 7.1  ALBUMIN 3.5  AST 166*  ALT 137*  ALKPHOS 454*  BILITOT 1.6*    Studies/Results: MR 3D Recon At Scanner  Result Date: 01/14/2021 CLINICAL DATA:  Intermittent abdominal pain since last Thursday. Cholelithiasis seen on recent ultrasound. EXAM: MRI ABDOMEN WITHOUT AND WITH CONTRAST (INCLUDING MRCP) TECHNIQUE: Multiplanar multisequence MR imaging of the abdomen was performed both before and after the administration of intravenous contrast. Heavily T2-weighted images of the biliary and pancreatic ducts were obtained, and three-dimensional MRCP images were rendered by post processing. CONTRAST:  11m GADAVIST GADOBUTROL 1 MMOL/ML IV SOLN COMPARISON:  Abdominal CT scan and 01/10/2021 and abdominal ultrasound 01/14/2021 FINDINGS: Lower chest: The lung bases are grossly clear. No pulmonary lesions or  pleural effusions. No pericardial effusion. Hepatobiliary: No hepatic lesions or intrahepatic biliary dilatation. The gallbladder has innumerable small layering gallstones. No gallbladder wall thickening or pericholecystic fluid or pericholecystic inflammatory changes to suggest acute cholecystitis. There is common bile duct dilatation as noted on the ultrasound. In the porta hepatis and measures 10 mm and in the head of the pancreas and measures a maximum 9.5 mm. It does taper normally toward the ampulla. There are at least 5 small calculi in the distal common bile duct collectively likely causing obstruction. Pancreas:  No  mass, inflammation or ductal dilatation. Spleen:  Normal size.  No focal lesions. Adrenals/Urinary Tract:  The adrenal glands and kidneys are normal. Stomach/Bowel: The stomach, duodenum, visualized small bowel and visualized colon are grossly normal. Vascular/Lymphatic: The aorta and branch vessels are patent. The major venous structures are patent. No mesenteric or retroperitoneal mass or adenopathy. Other:  No ascites or abdominal hernia. Musculoskeletal: No significant bony findings. IMPRESSION: 1. Cholelithiasis but no MR findings to suggest acute cholecystitis. 2. Dilated common bile duct and choledocholithiasis with at least 5 small calculi in the distal common bile duct. 3. No other significant abdominal findings, mass lesions or adenopathy. Electronically Signed   By: Marijo Sanes M.D.   On: 01/14/2021 15:28   MR ABDOMEN MRCP W WO CONTAST  Result Date: 01/14/2021 CLINICAL DATA:  Intermittent abdominal pain since last Thursday. Cholelithiasis seen on recent ultrasound. EXAM: MRI ABDOMEN WITHOUT AND WITH CONTRAST (INCLUDING MRCP) TECHNIQUE: Multiplanar multisequence MR imaging of the abdomen was performed both before and after the administration of intravenous contrast. Heavily T2-weighted images of the biliary and pancreatic ducts were obtained, and three-dimensional MRCP images  were rendered by post processing. CONTRAST:  57m GADAVIST GADOBUTROL 1 MMOL/ML IV SOLN COMPARISON:  Abdominal CT scan and 01/10/2021 and abdominal ultrasound 01/14/2021 FINDINGS: Lower chest: The lung bases are grossly clear. No pulmonary lesions or pleural effusions. No pericardial effusion. Hepatobiliary: No hepatic lesions or intrahepatic biliary dilatation. The gallbladder has innumerable small layering gallstones. No gallbladder wall thickening or pericholecystic fluid or pericholecystic inflammatory changes to suggest acute cholecystitis. There is common bile duct dilatation as noted on the ultrasound. In the porta hepatis and measures 10 mm and in the head of the pancreas and measures a maximum 9.5 mm. It does taper normally toward the ampulla. There are at least 5 small calculi in the distal common bile duct collectively likely causing obstruction. Pancreas:  No mass, inflammation or ductal dilatation. Spleen:  Normal size.  No focal lesions. Adrenals/Urinary Tract:  The adrenal glands and kidneys are normal. Stomach/Bowel: The stomach, duodenum, visualized small bowel and visualized colon are grossly normal. Vascular/Lymphatic: The aorta and branch vessels are patent. The major venous structures are patent. No mesenteric or retroperitoneal mass or adenopathy. Other:  No ascites or abdominal hernia. Musculoskeletal: No significant bony findings. IMPRESSION: 1. Cholelithiasis but no MR findings to suggest acute cholecystitis. 2. Dilated common bile duct and choledocholithiasis with at least 5 small calculi in the distal common bile duct. 3. No other significant abdominal findings, mass lesions or adenopathy. Electronically Signed   By: PMarijo SanesM.D.   On: 01/14/2021 15:28   [place]dom[place] Limited RUQ (LIVER/GB)  Result Date: 01/14/2021 CLINICAL DATA:  45year old female with 5 days of upper abdominal pain. EXAM: ULTRASOUND ABDOMEN LIMITED RIGHT UPPER QUADRANT COMPARISON:  CT Abdomen and Pelvis  01/10/2021. FINDINGS: Gallbladder: Evidence of small shadowing gallstones (images 8 and 39), individually estimated up to 12 mm. Probable superimposed sludge. Gallbladder wall thickness remains normal at 2-3 mm. No sonographic Murphy sign elicited. No pericholecystic fluid. Common bile duct: Diameter: 11-12 mm, dilated and increased from the recent CT (5-6 mm at that time). No filling defect in the visible duct (image 52). Liver: Echogenic liver (image 54). No discrete liver lesion. No definite intrahepatic ductal dilatation. Portal vein is patent on color Doppler imaging with normal direction of blood flow towards the liver. Other: Negative visible right kidney. IMPRESSION: 1. Dilated CBD (11 mm), new since the CT 4 days ago and highly  suspicious for Choledocholithiasis in the setting of small gallstones (#2). 2. Cholelithiasis and sludge but no evidence of acute cholecystitis. 3. Hepatic steatosis. Electronically Signed   By: Genevie Ann M.D.   On: 01/14/2021 09:07    Impression: Patient is a 45 year old female with significant history of large hiatal hernia s/p robotic repair 12/09/20, GERD and morbid obesity who presented to ED today with ongoing RUQ pain that began on 7/7 with one episode of blood-tinged emesis that day. Initially had similar RUQ pain prior to [facility] repair in June. She was Found to have elevated LFTs during ED course. [place] and MRCP were completed while in ED.  US revealed dilated CBD, concerning for choledocolithiasis, MRCP performed which revealed 5 small calclui in CBD. Cholelithiasis and sludge present in gallbladder without cholecystitis.  Elevated LFTs, AST 166, ALT 137 and Alk phos 454, total bili 1.6, consistent with choledocolithiasis, as seen on imaging today. LFTs appear to be WNL at baseline.   WBC 6.1, low concern for secondary cholangitis at this time due to lack of fever or leukocytosis.   Lipase normal at 21, no evidence of pancreatitis.  Indications, risks and benefits of  procedure discussed in detail with patient. Patient verbalized understanding and is in agreement to proceed with ERCP tomorrow. Will also need cholecystectomy in near future, time to be determined.    Plan: Plan for ERCP for removal of Choledocolithiasis tomorrow.  Should consider antibiotics if patient develops fever or leukocytosis prior to ERCP.  Clear liquids now then NPO at midnight. 4. Continue to trend LFTs.    LOS: 0 days    01/14/2021, 3:35 PM  Hayla Hinger L. Alver Sorrow, MSN, APRN, AGNP-C Adult-Gerontology Nurse Practitioner [facility]

## 2021-01-14 NOTE — ED Notes (Signed)
Returned from MRI 

## 2021-01-14 NOTE — ED Notes (Signed)
Pt gone to MRI again.

## 2021-01-15 ENCOUNTER — Inpatient Hospital Stay (HOSPITAL_COMMUNITY): Payer: Managed Care, Other (non HMO) | Admitting: Anesthesiology

## 2021-01-15 ENCOUNTER — Encounter (HOSPITAL_COMMUNITY): Payer: Self-pay | Admitting: Internal Medicine

## 2021-01-15 ENCOUNTER — Other Ambulatory Visit: Payer: Self-pay

## 2021-01-15 ENCOUNTER — Encounter (HOSPITAL_COMMUNITY)
Admission: EM | Disposition: A | Discharge status: Home / Self Care | Payer: Self-pay | Source: Home / Self Care | Attending: General Surgery

## 2021-01-15 ENCOUNTER — Inpatient Hospital Stay (HOSPITAL_COMMUNITY): Payer: Managed Care, Other (non HMO)

## 2021-01-15 DIAGNOSIS — K838 Other specified diseases of biliary tract: Secondary | ICD-10-CM

## 2021-01-15 DIAGNOSIS — K31819 Angiodysplasia of stomach and duodenum without bleeding: Secondary | ICD-10-CM

## 2021-01-15 DIAGNOSIS — Z9889 Other specified postprocedural states: Secondary | ICD-10-CM

## 2021-01-15 DIAGNOSIS — K8050 Calculus of bile duct without cholangitis or cholecystitis without obstruction: Secondary | ICD-10-CM

## 2021-01-15 HISTORY — PX: ESOPHAGOGASTRODUODENOSCOPY: SHX5428

## 2021-01-15 HISTORY — PX: ERCP: SHX5425

## 2021-01-15 HISTORY — PX: STONE EXTRACTION WITH BASKET: SHX5318

## 2021-01-15 HISTORY — PX: SPHINCTEROTOMY: SHX5279

## 2021-01-15 LAB — CBC
HCT: 37.7 % (ref 36.0–46.0)
Hemoglobin: 11.8 g/dL — ABNORMAL LOW (ref 12.0–15.0)
MCH: 30.6 pg (ref 26.0–34.0)
MCHC: 31.3 g/dL (ref 30.0–36.0)
MCV: 97.7 fL (ref 80.0–100.0)
Platelets: 288 10*3/uL (ref 150–400)
RBC: 3.86 MIL/uL — ABNORMAL LOW (ref 3.87–5.11)
RDW: 13.2 % (ref 11.5–15.5)
WBC: 6 10*3/uL (ref 4.0–10.5)
nRBC: 0 % (ref 0.0–0.2)

## 2021-01-15 LAB — COMPREHENSIVE METABOLIC PANEL
ALT: 137 U/L — ABNORMAL HIGH (ref 0–44)
AST: 158 U/L — ABNORMAL HIGH (ref 15–41)
Albumin: 3.2 g/dL — ABNORMAL LOW (ref 3.5–5.0)
Alkaline Phosphatase: 427 U/L — ABNORMAL HIGH (ref 38–126)
Anion gap: 8 (ref 5–15)
BUN: 9 mg/dL (ref 6–20)
CO2: 25 mmol/L (ref 22–32)
Calcium: 8.6 mg/dL — ABNORMAL LOW (ref 8.9–10.3)
Chloride: 102 mmol/L (ref 98–111)
Creatinine, Ser: 0.7 mg/dL (ref 0.44–1.00)
GFR, Estimated: >60 mL/min (ref >60)
Glucose, Bld: 102 mg/dL — ABNORMAL HIGH (ref 70–99)
Potassium: 4 mmol/L (ref 3.5–5.1)
Sodium: 135 mmol/L (ref 135–145)
Total Bilirubin: 1 mg/dL (ref 0.3–1.2)
Total Protein: 6.5 g/dL (ref 6.5–8.1)

## 2021-01-15 LAB — PROTIME-INR
INR: 1 (ref 0.8–1.2)
Prothrombin Time: 12.9 seconds (ref 11.4–15.2)

## 2021-01-15 SURGERY — ERCP, WITH INTERVENTION IF INDICATED
Anesthesia: General

## 2021-01-15 MED ORDER — PROPOFOL 10 MG/ML IV BOLUS
INTRAVENOUS | Status: DC | PRN
  Administered 2021-01-15: 200 mg via INTRAVENOUS

## 2021-01-15 MED ORDER — ROCURONIUM 10MG/ML (10ML) SYRINGE FOR MEDFUSION PUMP - OPTIME
INTRAVENOUS | Status: DC | PRN
  Administered 2021-01-15: 30 mg via INTRAVENOUS
  Administered 2021-01-15: 10 mg via INTRAVENOUS

## 2021-01-15 MED ORDER — METHYLPREDNISOLONE SODIUM SUCC 125 MG IJ SOLR
INTRAMUSCULAR | Status: DC | PRN
  Administered 2021-01-15: 40 mg via INTRAVENOUS

## 2021-01-15 MED ORDER — FENTANYL CITRATE (PF) 100 MCG/2ML IJ SOLN: 25.0000 ug | INTRAMUSCULAR | Status: DC | PRN

## 2021-01-15 MED ORDER — CEFAZOLIN SODIUM-DEXTROSE 2-4 GM/100ML-% IV SOLN
INTRAVENOUS | Status: AC
  Filled 2021-01-15: qty 100

## 2021-01-15 MED ORDER — FENTANYL CITRATE (PF) 100 MCG/2ML IJ SOLN
INTRAMUSCULAR | Status: DC | PRN
  Administered 2021-01-15 (×2): 50 ug via INTRAVENOUS

## 2021-01-15 MED ORDER — STERILE WATER FOR IRRIGATION IR SOLN
Status: DC | PRN
  Administered 2021-01-15: 500 mL

## 2021-01-15 MED ORDER — CHLORHEXIDINE GLUCONATE 0.12 % MT SOLN
OROMUCOSAL | Status: AC
  Administered 2021-01-15: 15 mL via OROMUCOSAL
  Filled 2021-01-15: qty 15

## 2021-01-15 MED ORDER — LACTATED RINGERS IV SOLN: INTRAVENOUS | Status: DC

## 2021-01-15 MED ORDER — LIDOCAINE HCL (CARDIAC) PF 50 MG/5ML IV SOSY
PREFILLED_SYRINGE | INTRAVENOUS | Status: DC | PRN
  Administered 2021-01-15: 80 mg via INTRAVENOUS

## 2021-01-15 MED ORDER — GLUCAGON HCL RDNA (DIAGNOSTIC) 1 MG IJ SOLR
INTRAMUSCULAR | Status: AC
  Filled 2021-01-15: qty 1

## 2021-01-15 MED ORDER — ONDANSETRON HCL 4 MG/2ML IJ SOLN: 4.0000 mg | Freq: Once | INTRAMUSCULAR | Status: DC | PRN

## 2021-01-15 MED ORDER — SODIUM CHLORIDE 0.9 % IV SOLN
INTRAVENOUS | Status: DC | PRN
  Administered 2021-01-15: 25 mL

## 2021-01-15 MED ORDER — SUCCINYLCHOLINE 20MG/ML (10ML) SYRINGE FOR MEDFUSION PUMP - OPTIME
INTRAMUSCULAR | Status: DC | PRN
  Administered 2021-01-15: 120 mg via INTRAVENOUS

## 2021-01-15 MED ORDER — ORAL CARE MOUTH RINSE: 15.0000 mL | Freq: Once | OROMUCOSAL | Status: AC

## 2021-01-15 MED ORDER — SODIUM CHLORIDE 0.9 % IV SOLN: INTRAVENOUS | Status: DC

## 2021-01-15 MED ORDER — FENTANYL CITRATE (PF) 250 MCG/5ML IJ SOLN
INTRAMUSCULAR | Status: AC
  Filled 2021-01-15: qty 5

## 2021-01-15 MED ORDER — MIDAZOLAM HCL 2 MG/2ML IJ SOLN
INTRAMUSCULAR | Status: AC
  Filled 2021-01-15: qty 2

## 2021-01-15 MED ORDER — CEFAZOLIN IN SODIUM CHLORIDE 3-0.9 GM/100ML-% IV SOLN
3.0000 g | Freq: Once | INTRAVENOUS | Status: AC
  Administered 2021-01-15: 2 g via INTRAVENOUS

## 2021-01-15 MED ORDER — PROPOFOL 10 MG/ML IV BOLUS
INTRAVENOUS | Status: AC
  Filled 2021-01-15: qty 40

## 2021-01-15 MED ORDER — ONDANSETRON HCL 4 MG/2ML IJ SOLN
INTRAMUSCULAR | Status: AC
  Filled 2021-01-15: qty 2

## 2021-01-15 MED ORDER — ONDANSETRON HCL 4 MG/2ML IJ SOLN
INTRAMUSCULAR | Status: DC | PRN
  Administered 2021-01-15 (×2): 4 mg via INTRAVENOUS

## 2021-01-15 MED ORDER — CHLORHEXIDINE GLUCONATE 0.12 % MT SOLN: 15.0000 mL | Freq: Once | OROMUCOSAL | Status: AC

## 2021-01-15 MED ORDER — HYDROMORPHONE HCL 1 MG/ML IJ SOLN
1.0000 mg | INTRAMUSCULAR | Status: DC | PRN
  Administered 2021-01-15 – 2021-01-17 (×3): 1 mg via INTRAVENOUS
  Filled 2021-01-15 (×3): qty 1

## 2021-01-15 MED ORDER — PHENOL 1.4 % MT LIQD
1.0000 | OROMUCOSAL | Status: DC | PRN
  Filled 2021-01-15: qty 177

## 2021-01-15 MED ORDER — KETOROLAC TROMETHAMINE 15 MG/ML IJ SOLN
15.0000 mg | Freq: Four times a day (QID) | INTRAMUSCULAR | Status: DC | PRN
  Administered 2021-01-15: 30 mg via INTRAVENOUS
  Administered 2021-01-15: 15 mg via INTRAVENOUS
  Administered 2021-01-16: 30 mg via INTRAVENOUS
  Filled 2021-01-15 (×4): qty 2

## 2021-01-15 MED ORDER — SUGAMMADEX SODIUM 200 MG/2ML IV SOLN
INTRAVENOUS | Status: DC | PRN
  Administered 2021-01-15: 300 mg via INTRAVENOUS

## 2021-01-15 MED ORDER — MIDAZOLAM HCL 5 MG/5ML IJ SOLN
INTRAMUSCULAR | Status: DC | PRN
  Administered 2021-01-15: 2 mg via INTRAVENOUS

## 2021-01-15 MED ORDER — CEFAZOLIN SODIUM-DEXTROSE 2-4 GM/100ML-% IV SOLN: 2.0000 g | Freq: Once | INTRAVENOUS | Status: DC

## 2021-01-15 MED ORDER — SODIUM CHLORIDE 0.9 % IV SOLN
INTRAVENOUS | Status: AC
  Filled 2021-01-15: qty 50

## 2021-01-15 MED ORDER — SODIUM CHLORIDE 0.9 % IV SOLN
25.0000 mg | INTRAVENOUS | Status: DC | PRN
  Administered 2021-01-17: 25 mg via INTRAVENOUS
  Filled 2021-01-15 (×2): qty 0.5

## 2021-01-15 SURGICAL SUPPLY — 25 items
BALLN RETRIEVAL 12X15 (BALLOONS) IMPLANT
BALN RTRVL 200 6-7FR 12-15 (BALLOONS)
BASKET TRAPEZOID 3X6 (MISCELLANEOUS) IMPLANT
BASKET TRAPEZOID LITHO 2.0X5 (MISCELLANEOUS) ×2 IMPLANT
BSKT STON RTRVL TRAPEZOID 2X5 (MISCELLANEOUS)
BSKT STON RTRVL TRAPEZOID 3X6 (MISCELLANEOUS)
DEVICE INFLATION ENCORE 26 (MISCELLANEOUS) IMPLANT
DEVICE LOCKING W-BIOPSY CAP (MISCELLANEOUS) ×3 IMPLANT
GUIDEWIRE HYDRA JAGWIRE .35 (WIRE) IMPLANT
GUIDEWIRE JAG HINI 025X260CM (WIRE) IMPLANT
KIT ENDO PROCEDURE PEN (KITS) ×3 IMPLANT
KIT TURNOVER KIT A (KITS) ×3 IMPLANT
LUBRICANT JELLY 4.5OZ STERILE (MISCELLANEOUS) IMPLANT
PAD ARMBOARD 7.5X6 YLW CONV (MISCELLANEOUS) ×3 IMPLANT
POSITIONER HEAD 8X9X4 ADT (SOFTGOODS) IMPLANT
SCOPE SPY DS DISPOSABLE (MISCELLANEOUS) ×2 IMPLANT
SNARE ROTATE MED OVAL 20MM (MISCELLANEOUS) IMPLANT
SNARE SHORT THROW 13M SML OVAL (MISCELLANEOUS) IMPLANT
SPHINCTEROTOME AUTOTOME .25 (MISCELLANEOUS) IMPLANT
SPHINCTEROTOME HYDRATOME 44 (MISCELLANEOUS) ×2 IMPLANT
SYSTEM CONTINUOUS INJECTION (MISCELLANEOUS) ×2 IMPLANT
TUBING INSUFFLATOR CO2MPACT (TUBING) ×3 IMPLANT
WALLSTENT METAL COVERED 10X60 (STENTS) IMPLANT
WALLSTENT METAL COVERED 10X80 (STENTS) IMPLANT
WATER STERILE IRR 1000ML POUR (IV SOLUTION) ×3 IMPLANT

## 2021-01-15 NOTE — Anesthesia Preprocedure Evaluation (Signed)
Anesthesia Evaluation  Patient identified by MRN, date of birth, ID band Patient awake    Reviewed: Allergy & Precautions, H&P , NPO status , Patient's Chart, lab work & pertinent test results, reviewed documented beta blocker date and time   Airway Mallampati: II  TM Distance: >3 FB Neck ROM: full    Dental no notable dental hx.    Pulmonary neg pulmonary ROS, former smoker,    Pulmonary exam normal breath sounds clear to auscultation       Cardiovascular Exercise Tolerance: Good negative cardio ROS   Rhythm:regular Rate:Normal     Neuro/Psych  Headaches, PSYCHIATRIC DISORDERS Depression  Neuromuscular disease    GI/Hepatic Neg liver ROS, GERD  Medicated,  Endo/Other  Morbid obesity  Renal/GU negative Renal ROS  negative genitourinary   Musculoskeletal   Abdominal   Peds  Hematology negative hematology ROS (+)   Anesthesia Other Findings   Reproductive/Obstetrics negative OB ROS                             Anesthesia Physical Anesthesia Plan  ASA: 3  Anesthesia Plan: General and General ETT   Post-op Pain Management:    Induction:   PONV Risk Score and Plan: Ondansetron  Airway Management Planned:   Additional Equipment:   Intra-op Plan:   Post-operative Plan:   Informed Consent: I have reviewed the patients History and Physical, chart, labs and discussed the procedure including the risks, benefits and alternatives for the proposed anesthesia with the patient or authorized representative who has indicated his/her understanding and acceptance.     Dental Advisory Given  Plan Discussed with: CRNA  Anesthesia Plan Comments:         Anesthesia Quick Evaluation

## 2021-01-15 NOTE — Transfer of Care (Signed)
Immediate Anesthesia Transfer of Care Note  Patient: Tiffany Chambers  Procedure(s) Performed: ENDOSCOPIC RETROGRADE CHOLANGIOPANCREATOGRAPHY (ERCP) ESOPHAGOGASTRODUODENOSCOPY (EGD) SPHINCTEROTOMY WITH MILD DILATION THREE SMALL STONES BALLOON EXTRACTION  Patient Location: PACU  Anesthesia Type:General  Level of Consciousness: awake  Airway & Oxygen Therapy: Patient Spontanous Breathing  Post-op Assessment: Report given to RN  Post vital signs: Reviewed  Last Vitals:  Vitals Value Taken Time  BP 139/85 01/15/21 1210  Temp    Pulse 82 01/15/21 1212  Resp 17 01/15/21 1212  SpO2 96 % 01/15/21 1212  Vitals shown include unvalidated device data.  Last Pain:  Vitals:   01/15/21 1017  TempSrc: Oral  PainSc: 5       Patients Stated Pain Goal: 6 (01/15/21 1017)  Complications: No notable events documented.

## 2021-01-15 NOTE — Op Note (Signed)
[facility] Patient Name: Tiffany Chambers Procedure Date: 01/15/2021 10:40 AM MRN: [id number] Date of Birth: June 19, 1976 Attending MD: Lionel December , MD CSN: [id number] Age: 45 Admit Type: Inpatient Procedure:                EGD and ERCP Indications:              Common bile duct stone(s), Diagnostic procedure                            assess upper GI tract; recent laparoscopic surgery                            for large hiatal hernia. Providers:                Lionel December, MD, Sheran Fava, Burke Keels, Technician Referring MD:             Erick Blinks, MD Medicines:                General Anesthesia Complications:            No immediate complications. Estimated Blood Loss:     Estimated blood loss: none. Procedure:                After obtaining informed consent, the scope was                            passed under direct vision. Throughout the                            procedure, the patient's blood pressure, pulse, and                            oxygen saturations were monitored continuously.                           Upper GI tract was first examined with Q-scope.                            Mucosa of the esophagus was normal. GE junction was                            irregular located at 38 cm from the incisors.                            Examination stomach revealed mild nonbleeding                            gastric antral vascular ectasia. Partial wrap was                            intact. Bulbar and post bulbar mucosa was normal.  The [id number]) was introduced through the                            mouth, and used to inject contrast into. [employer] D single use duodenoscope                            was introduced through the and used to inject                            contrast into and used to inject contrast into the                             bile duct. Scope In: 11:34:29 AM Scope Out: 11:52:41 AM Total Procedure Duration: 0 hours 18 minutes 12 seconds  Findings:      The scout film was normal. The esophagus was successfully intubated       under direct vision. The scope was advanced to a normal major papilla in       the descending duodenum without detailed examination of the pharynx,       larynx and associated structures, and upper GI tract. The upper GI tract       was grossly normal. The major papilla was adjacent to a diverticulum.       The bile duct was deeply cannulated with the Hydratome sphincterotome.       Contrast was injected. I personally interpreted the bile duct images.       Ductal flow of contrast was adequate. Image quality was excellent.       Contrast extended to the entire biliary tree. The common bile duct and       common hepatic duct were mildly dilated and diffusely dilated. The       biliary sphincterotomy was extended to a total of 7 mm in length with a       braided traction (standard) sphincterotome using ERBE electrocautery.       There was no post-sphincterotomy bleeding. Three small yellow stones       came out spontaneously. To determine the sphincterotomy size, the       biliary tree was swept with a 10 mm balloon starting at the bifurcation.       Sphincterotomy site was adequate and no more stones were present. Impression:               - Partial fundal wrap intact.                           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                           - The major papilla was adjacent to a diverticulum  on either side.                           - The common bile duct and common hepatic duct were                            mildly dilated.                           - A biliary sphincterotomy was performed.                           - Three small stones came out spontaneously.                            - No more stones noted on passing balloon through                            the bile duct. Moderate Sedation:      Per Anesthesia Care Recommendation:           - Return patient to hospital ward for ongoing care.                           - Avoid aspirin and anticoagulants for 3 days.                           - Continue present medications.                           - Surgical consultation for consideration of                            cholecystectomy. Procedure Code(s):        --- Professional ---                           [phone], Endoscopic retrograde                            cholangiopancreatography (ERCP); with                            sphincterotomy/papillotomy Diagnosis Code(s):        --- Professional ---                           K80.50, Calculus of bile duct without cholangitis                            or cholecystitis without obstruction                           K83.8, Other specified diseases of biliary tract CPT copyright 2019 American Medical Association. Chambers rights reserved. The codes documented in this report are preliminary and upon coder review may  be revised to meet current compliance requirements. Lionel December, MD Lionel December, MD 01/15/2021 12:38:48 PM  This report has been signed electronically. Number of Addenda: 0

## 2021-01-15 NOTE — Progress Notes (Signed)
Subjective:  Patient feels better.  She states she had exactly same symptoms prior to laparoscopic repair of hernia about 5 weeks ago.  She denies chest or abdominal pain.  She did notice her urine to be yellow over the weekend.  Current Medications:  Current Facility-Administered Medications:    0.9 %  sodium chloride infusion, , Intravenous, Continuous, Memon, Jolaine Artist, MD, Stopped at 01/14/21 2007   0.9 % NaCl with KCl 20 mEq/ L  infusion, , Intravenous, Continuous, Kathie Dike, MD, Last Rate: 75 mL/hr at 01/14/21 2014, New Bag at 01/14/21 2014   [MAR Hold] acetaminophen (TYLENOL) tablet 650 mg, 650 mg, Oral, Q6H PRN **OR** [MAR Hold] acetaminophen (TYLENOL) suppository 650 mg, 650 mg, Rectal, Q6H PRN, Kathie Dike, MD   [MAR Hold] diphenhydrAMINE (BENADRYL) 25 mg in sodium chloride 0.9 % 50 mL IVPB, 25 mg, Intravenous, Q4H PRN, Mansy, Arvella Merles, MD   [facility] Hold] diphenhydrAMINE (BENADRYL) capsule 25 mg, 25 mg, Oral, Q4H PRN, Mansy, Jan A, MD, 25 mg at 01/15/21 0355   fentaNYL (SUBLIMAZE) injection 25-50 mcg, 25-50 mcg, Intravenous, Q5 min PRN, Briant Cedar, Coralie Keens, MD   glucagon (human recombinant) (GLUCAGEN) 1 MG injection, , , ,    [MAR Hold] heparin injection 5,000 Units, 5,000 Units, Subcutaneous, Q8H, Memon, Jolaine Artist, MD, 5,000 Units at 01/15/21 0604   [facility] Hold] HYDROmorphone (DILAUDID) injection 1 mg, 1 mg, Intravenous, Q3H PRN, Mansy, Jan A, MD, 1 mg at 01/15/21 0130   [MAR Hold] ketorolac (TORADOL) 15 MG/ML injection 15-30 mg, 15-30 mg, Intravenous, Q6H PRN, Mansy, Jan A, MD, 30 mg at 01/15/21 0422   lactated ringers infusion, , Intravenous, Continuous, Kiel, Coralie Keens, MD, Last Rate: 10 mL/hr at 01/15/21 1039, Continued from Pre-op at 01/15/21 1039   [MAR Hold] ondansetron (ZOFRAN) tablet 4 mg, 4 mg, Oral, Q6H PRN **OR** [MAR Hold] ondansetron (ZOFRAN) injection 4 mg, 4 mg, Intravenous, Q6H PRN, Memon, Jehanzeb, MD   ondansetron (ZOFRAN) injection 4 mg, 4 mg, Intravenous, Once PRN,  Briant Cedar, Coralie Keens, MD   [MAR Hold] pantoprazole (PROTONIX) injection 40 mg, 40 mg, Intravenous, Q24H, Memon, Jehanzeb, MD, 40 mg at 01/14/21 2125   sodium chloride 0.9 % infusion, , , ,    Objective: Blood pressure 136/73, pulse 68, temperature 97.6 F (36.4 C), temperature source Oral, resp. rate 11, height 5' 4" (1.626 m), weight 134.1 kg, SpO2 99 %. Patient is alert and in no acute distress. Conjunctiva is pink. Sclera is nonicteric Oropharyngeal mucosa is normal. No neck masses or thyromegaly noted. Cardiac exam with regular rhythm normal S1 and S2. No murmur or gallop noted. Lungs are clear to auscultation. Abdomen is symmetrical with multiple laparoscopy scars.  On palpation abdomen is soft.  She has mild midepigastric tenderness.  No organomegaly or masses. No LE edema or clubbing noted.  Labs/studies Results:   CBC Latest Ref Rng & Units 01/15/2021 01/14/2021 01/09/2021  WBC 4.0 - 10.5 K/uL 6.0 6.1 9.4  Hemoglobin 12.0 - 15.0 g/dL 11.8(L) 12.9 12.1  Hematocrit 36.0 - 46.0 % 37.7 38.7 35.5(L)  Platelets 150 - 400 K/uL 288 335 333    CMP Latest Ref Rng & Units 01/15/2021 01/14/2021 01/09/2021  Glucose 70 - 99 mg/dL 102(H) 98 101(H)  BUN 6 - 20 mg/dL _0 Creatinine 0.44 - 1.00 mg/dL 0.70 0.73 0.81  Sodium 135 - 145 mmol/L 135 133(L) 134(L)  Potassium 3.5 - 5.1 mmol/L 4.0 3.9 4.3  Chloride 98 - 111 mmol/L 102 101 103  CO2 22 -  32 mmol/L 25 23 21(L)  Calcium 8.9 - 10.3 mg/dL 8.6(L) 9.0 9.0  Total Protein 6.5 - 8.1 g/dL 6.5 7.1 6.7  Total Bilirubin 0.3 - 1.2 mg/dL 1.0 1.6(H) 1.3(H)  Alkaline Phos 38 - 126 U/L 427(H) 454(H) 98  AST 15 - 41 U/L 158(H) 166(H) 37  ALT 0 - 44 U/L 137(H) 137(H) 11    Hepatic Function Latest Ref Rng & Units 01/15/2021 01/14/2021 01/09/2021  Total Protein 6.5 - 8.1 g/dL 6.5 7.1 6.7  Albumin 3.5 - 5.0 g/dL 3.2(L) 3.5 3.3(L)  AST 15 - 41 U/L 158(H) 166(H) 37  ALT 0 - 44 U/L 137(H) 137(H) 11  Alk Phosphatase 38 - 126 U/L 427(H) 454(H) 98  Total Bilirubin  0.3 - 1.2 mg/dL 1.0 1.6(H) 1.3(H)    I have reviewed patient's CT and MRCP.  She has multiple small stones in the gallbladder and few in the CBD.  Assessment:  #1.  Choledocholithiasis.  Transaminases remain elevated.  She will undergo ERCP with sphincterotomy and stone extraction prior to cholecystectomy.  #2.  History of paraesophageal large hiatal hernia for which she underwent laparoscopic repair on 12/10/2020.  She also had partial wrap.  She will undergo EGD prior to passing the duodenoscope.  Plan:  Esophagogastroduodenoscopy followed by ERCP with sphincterotomy and stone extraction. I have reviewed the procedure risks with the patient and she is agreeable.      

## 2021-01-15 NOTE — Anesthesia Procedure Notes (Signed)
Procedure Name: Intubation Date/Time: 01/15/2021 11:14 AM Performed by: Ollen Bowl, CRNA Pre-anesthesia Checklist: Patient identified, Patient being monitored, Timeout performed, Emergency Drugs available and Suction available Patient Re-evaluated:Patient Re-evaluated prior to induction Oxygen Delivery Method: Circle system utilized Preoxygenation: Pre-oxygenation with 100% oxygen Induction Type: IV induction Ventilation: Mask ventilation without difficulty Laryngoscope Size: Mac and 3 Grade View: Grade I Tube type: Oral Tube size: 7.0 mm Number of attempts: 1 Airway Equipment and Method: Stylet Placement Confirmation: ETT inserted through vocal cords under direct vision, positive ETCO2 and breath sounds checked- equal and bilateral Secured at: 21 cm Tube secured with: Tape Dental Injury: Teeth and Oropharynx as per pre-operative assessment

## 2021-01-15 NOTE — Progress Notes (Signed)
Patient pleasant, alert, oriented, and with no complaints of pain since return from ERCP. Patient is for possible surgery tomorrow or Friday, with npo after mid night orders in the computer. I have filled out a consent to be signed either tomorrow morning or Friday morning, depending on when the final say for surgery is made. She is eager. Patient has SCDs on. Tolerating liquids well.

## 2021-01-15 NOTE — Progress Notes (Signed)
Brief EGD note  Normal mucosa of the esophagus Irregular GE junction at 38 cm from the incisors. Partial wrap intact. Mild antral vascular ectasia without stigmata of bleed. Normal D1 and D2.   ERCP note.  Normal-appearing ampulla of Vater with long intramural segment. Small diverticulum on either side of the papilla. Mildly dilated CBD and CHD.  No obvious filling defects. Biliary sphincterotomy performed and 3 small stones came out spontaneously. Balloon stone extractor passed through the duct and across the sphincterotomy.  Patient tolerated the procedure well.

## 2021-01-15 NOTE — Anesthesia Postprocedure Evaluation (Signed)
Anesthesia Post Note  Patient: Tiffany Chambers  Procedure(s) Performed: ENDOSCOPIC RETROGRADE CHOLANGIOPANCREATOGRAPHY (ERCP) ESOPHAGOGASTRODUODENOSCOPY (EGD) SPHINCTEROTOMY WITH MILD DILATION THREE SMALL STONES BALLOON EXTRACTION  Patient location during evaluation: PACU Anesthesia Type: General Level of consciousness: awake and alert and oriented Pain management: pain level controlled Vital Signs Assessment: post-procedure vital signs reviewed and stable Respiratory status: spontaneous breathing Cardiovascular status: blood pressure returned to baseline and stable Postop Assessment: no apparent nausea or vomiting Anesthetic complications: no   No notable events documented.   Last Vitals:  Vitals:   01/15/21 1215 01/15/21 1230  BP: 139/79 119/80  Pulse: 77 74  Resp: 13 18  Temp:  36.6 C  SpO2: 96% 96%    Last Pain:  Vitals:   01/15/21 1230  TempSrc:   PainSc: 0-No pain                 [facility]

## 2021-01-15 NOTE — Consult Note (Signed)
Patient seen, chart reviewed.  Full consult to follow.  Intend on proceeding with laparoscopic cholecystectomy on Friday, July 15 pending lab results tomorrow.

## 2021-01-15 NOTE — Progress Notes (Signed)
Patient off the unit for ERCP.

## 2021-01-15 NOTE — Progress Notes (Signed)
PROGRESS NOTE    Tiffany Chambers  UXN:[id number] DOB: 1976/01/25 DOA: 01/14/2021 PCP: Donita Brooks, MD   Brief Narrative:   Tiffany Chambers is a 45 y.o. female with medical history significant of GERD, recent surgery for large hiatal hernia in 12/2020, class III obesity, depression, presented to the hospital with complaints of right upper quadrant abdominal pain.  She has undergone ERCP with biliary sphincterotomy.  3 stones were spontaneously removed.  Plans are for laparoscopic cholecystectomy in the next 1-2 days.  Assessment & Plan:   Active Problems:   GERD (gastroesophageal reflux disease)   Depression   Morbid obesity (HCC)   Choledocholithiasis   Elevated LFTs   Cholelithiasis   Cholelithiasis and choledocholithiasis status post ERCP -3 stones were spontaneously removed, no complications -We will keep on clear liquids for now, n.p.o. after midnight -Continue supportive management -General surgery consulted for cholecystectomy hopefully in the next 1-2 days -Plan to hold antiplatelet agents including heparin for the next 72 hours   GERD -Continue on PPI   Depression -Chronically on Celexa -Can resume after procedures  Morbid obesity -Lifestyle changes outpatient   DVT prophylaxis:SCDs for next 72 hours Code Status: Full Family Communication: Husband at bedside Disposition Plan:  Status is: Inpatient  Remains inpatient appropriate because:Ongoing diagnostic testing needed not appropriate for outpatient work up, IV treatments appropriate due to intensity of illness or inability to take PO, and Inpatient level of care appropriate due to severity of illness  Dispo: The patient is from: Home              Anticipated d/c is to: Home              Patient currently is not medically stable to d/c.   Difficult to place patient No   Consultants:  GI GS  Procedures:  ERCP 7/13  Antimicrobials:  Anti-infectives (From admission, onward)    Start      Dose/Rate Route Frequency Ordered Stop   01/15/21 1130  ceFAZolin (ANCEF) IVPB 3g/100 mL premix        3 g 200 mL/hr over 30 Minutes Intravenous  Once 01/15/21 1115 01/15/21 1130   01/15/21 1115  ceFAZolin (ANCEF) IVPB 2g/100 mL premix  Status:  Discontinued        2 g 200 mL/hr over 30 Minutes Intravenous  Once 01/15/21 1111 01/15/21 1115   01/15/21 1111  ceFAZolin (ANCEF) 2-4 GM/100ML-% IVPB       Note to Pharmacy: Earl Many   : cabinet override      01/15/21 1111 01/15/21 2314       Subjective: Patient seen and evaluated today with no new acute complaints or concerns. No acute concerns or events noted overnight.  She is status post ERCP and has minimal complaints.  Objective: Vitals:   01/15/21 1210 01/15/21 1215 01/15/21 1230 01/15/21 1328  BP: 139/85 139/79 119/80 125/75  Pulse:  77 74 75  Resp: 14 13 18 18   Temp: 98.3 F (36.8 C)  97.9 F (36.6 C) 98.5 F (36.9 C)  TempSrc:    Oral  SpO2:  96% 96% 98%  Weight:      Height:        Intake/Output Summary (Last 24 hours) at 01/15/2021 1426 Last data filed at 01/15/2021 1212 Gross per 24 hour  Intake 900 ml  Output 0 ml  Net 900 ml   Filed Weights   01/14/21 0732 01/14/21 1755  Weight: 136.1 kg 134.1 kg  Examination:  General exam: Appears calm and comfortable, obese Respiratory system: Clear to auscultation. Respiratory effort normal. Cardiovascular system: S1 & S2 heard, RRR.  Gastrointestinal system: Abdomen is soft Central nervous system: Alert and awake Extremities: No edema Skin: No significant lesions noted Psychiatry: Flat affect.    Data Reviewed: I have personally reviewed following labs and imaging studies  CBC: Recent Labs  Lab 01/09/21 1536 01/14/21 0901 01/15/21 0510  WBC 9.4 6.1 6.0  NEUTROABS 6.3 3.7  --   HGB 12.1 12.9 11.8*  HCT 35.5* 38.7 37.7  MCV 88.5 90.4 97.7  PLT 333 335 288   Basic Metabolic Panel: Recent Labs  Lab 01/09/21 1536 01/14/21 0901 01/15/21 0510   NA 134* 133* 135  K 4.3 3.9 4.0  CL 103 101 102  CO2 21* 23 25  GLUCOSE 101* 98 102*  BUN 10 9 9   CREATININE 0.81 0.73 0.70  CALCIUM 9.0 9.0 8.6*   GFR: Estimated Creatinine Clearance: 120 mL/min (by C-G formula based on SCr of 0.7 mg/dL). Liver Function Tests: Recent Labs  Lab 01/09/21 1536 01/14/21 0901 01/15/21 0510  AST 37 166* 158*  ALT 11 137* 137*  ALKPHOS 98 454* 427*  BILITOT 1.3* 1.6* 1.0  PROT 6.7 7.1 6.5  ALBUMIN 3.3* 3.5 3.2*   Recent Labs  Lab 01/09/21 1536 01/14/21 0901  LIPASE 27 21   No results for input(s): AMMONIA in the last 168 hours. Coagulation Profile: Recent Labs  Lab 01/15/21 0510  INR 1.0   Cardiac Enzymes: No results for input(s): CKTOTAL, CKMB, CKMBINDEX, TROPONINI in the last 168 hours. BNP (last 3 results) No results for input(s): PROBNP in the last 8760 hours. HbA1C: No results for input(s): HGBA1C in the last 72 hours. CBG: No results for input(s): GLUCAP in the last 168 hours. Lipid Profile: No results for input(s): CHOL, HDL, LDLCALC, TRIG, CHOLHDL, LDLDIRECT in the last 72 hours. Thyroid Function Tests: No results for input(s): TSH, T4TOTAL, FREET4, T3FREE, THYROIDAB in the last 72 hours. Anemia Panel: No results for input(s): VITAMINB12, FOLATE, FERRITIN, TIBC, IRON, RETICCTPCT in the last 72 hours. Sepsis Labs: No results for input(s): PROCALCITON, LATICACIDVEN in the last 168 hours.  Recent Results (from the past 240 hour(s))  Resp Panel by RT-PCR (Flu A&B, Covid) Nasopharyngeal Swab     Status: None   Collection Time: 01/14/21 10:40 AM   Specimen: Nasopharyngeal Swab; Nasopharyngeal(NP) swabs in vial transport medium  Result Value Ref Range Status   SARS Coronavirus 2 by RT PCR NEGATIVE NEGATIVE Final    Comment: (NOTE) SARS-CoV-2 target nucleic acids are NOT DETECTED.  The SARS-CoV-2 RNA is generally detectable in upper respiratory specimens during the acute phase of infection. The lowest concentration of  SARS-CoV-2 viral copies this assay can detect is 138 copies/mL. A negative result does not preclude SARS-Cov-2 infection and should not be used as the sole basis for treatment or other patient management decisions. A negative result may occur with  improper specimen collection/handling, submission of specimen other than nasopharyngeal swab, presence of viral mutation(s) within the areas targeted by this assay, and inadequate number of viral copies(<138 copies/mL). A negative result must be combined with clinical observations, patient history, and epidemiological information. The expected result is Negative.  Fact Sheet for Patients:  03/17/21  Fact Sheet for Healthcare Providers:  BloggerCourse.com  This test is no t yet approved or cleared by the [place] FDA and  has been authorized for detection and/or diagnosis of SARS-CoV-2 by FDA under  an Emergency Use Authorization (EUA). This EUA will remain  in effect (meaning this test can be used) for the duration of the COVID-19 declaration under Section 564(b)(1) of the Act, 21 U.S.C.section 360bbb-3(b)(1), unless the authorization is terminated  or revoked sooner.       Influenza A by PCR NEGATIVE NEGATIVE Final   Influenza B by PCR NEGATIVE NEGATIVE Final    Comment: (NOTE) The Xpert Xpress SARS-CoV-2/FLU/RSV plus assay is intended as an aid in the diagnosis of influenza from Nasopharyngeal swab specimens and should not be used as a sole basis for treatment. Nasal washings and aspirates are unacceptable for Xpert Xpress SARS-CoV-2/FLU/RSV testing.  Fact Sheet for Patients: BloggerCourse.com  Fact Sheet for Healthcare Providers: SeriousBroker.it  This test is not yet approved or cleared by the [place] FDA and has been authorized for detection and/or diagnosis of SARS-CoV-2 by FDA under an Emergency Use  Authorization (EUA). This EUA will remain in effect (meaning this test can be used) for the duration of the COVID-19 declaration under Section 564(b)(1) of the Act, 21 U.S.C. section 360bbb-3(b)(1), unless the authorization is terminated or revoked.  Performed at [facility], [address] [place], [place] [address]          Radiology Studies: MR 3D Recon At Scanner  Result Date: 01/14/2021 CLINICAL DATA:  Intermittent abdominal pain since last Thursday. Cholelithiasis seen on recent ultrasound. EXAM: MRI ABDOMEN WITHOUT AND WITH CONTRAST (INCLUDING MRCP) TECHNIQUE: Multiplanar multisequence MR imaging of the abdomen was performed both before and after the administration of intravenous contrast. Heavily T2-weighted images of the biliary and pancreatic ducts were obtained, and three-dimensional MRCP images were rendered by post processing. CONTRAST:  69mL GADAVIST GADOBUTROL 1 MMOL/ML IV SOLN COMPARISON:  Abdominal CT scan and 01/10/2021 and abdominal ultrasound 01/14/2021 FINDINGS: Lower chest: The lung bases are grossly clear. No pulmonary lesions or pleural effusions. No pericardial effusion. Hepatobiliary: No hepatic lesions or intrahepatic biliary dilatation. The gallbladder has innumerable small layering gallstones. No gallbladder wall thickening or pericholecystic fluid or pericholecystic inflammatory changes to suggest acute cholecystitis. There is common bile duct dilatation as noted on the ultrasound. In the porta hepatis and measures 10 mm and in the head of the pancreas and measures a maximum 9.5 mm. It does taper normally toward the ampulla. There are at least 5 small calculi in the distal common bile duct collectively likely causing obstruction. Pancreas:  No mass, inflammation or ductal dilatation. Spleen:  Normal size.  No focal lesions. Adrenals/Urinary Tract:  The adrenal glands and kidneys are normal. Stomach/Bowel: The stomach, duodenum, visualized small bowel and visualized  colon are grossly normal. Vascular/Lymphatic: The aorta and branch vessels are patent. The major venous structures are patent. No mesenteric or retroperitoneal mass or adenopathy. Other:  No ascites or abdominal hernia. Musculoskeletal: No significant bony findings. IMPRESSION: 1. Cholelithiasis but no MR findings to suggest acute cholecystitis. 2. Dilated common bile duct and choledocholithiasis with at least 5 small calculi in the distal common bile duct. 3. No other significant abdominal findings, mass lesions or adenopathy. Electronically Signed   By: Rudie Meyer M.D.   On: 01/14/2021 15:28   DG ERCP  Result Date: 01/15/2021 CLINICAL DATA:  Choledocholithiasis EXAM: ERCP TECHNIQUE: Multiple spot images obtained with the fluoroscopic device and submitted for interpretation post-procedure. FLUOROSCOPY TIME:  Fluoroscopy Time:  1 minutes 30 seconds Radiation Exposure Index (if provided by the fluoroscopic device): 60.5 mGy Number of Acquired Spot Images: 5.  9 cine loops were obtained.  COMPARISON:  None. FINDINGS: Submitted ERCP images demonstrate cannulation and opacification of the common bile duct. Cine loop demonstrates balloon sweep of the CBD. No filling defects are identified within the CBD. IMPRESSION: Intraoperative fluoroscopic images of ERCP as above. These images were submitted for radiologic interpretation only. Please see the procedural report for the amount of contrast and the fluoroscopy time utilized. Electronically Signed   By: Acquanetta BellingFarhaan  Mir M.D.   On: 01/15/2021 14:05   MR ABDOMEN MRCP W WO CONTAST  Result Date: 01/14/2021 CLINICAL DATA:  Intermittent abdominal pain since last Thursday. Cholelithiasis seen on recent ultrasound. EXAM: MRI ABDOMEN WITHOUT AND WITH CONTRAST (INCLUDING MRCP) TECHNIQUE: Multiplanar multisequence MR imaging of the abdomen was performed both before and after the administration of intravenous contrast. Heavily T2-weighted images of the biliary and pancreatic  ducts were obtained, and three-dimensional MRCP images were rendered by post processing. CONTRAST:  10mL GADAVIST GADOBUTROL 1 MMOL/ML IV SOLN COMPARISON:  Abdominal CT scan and 01/10/2021 and abdominal ultrasound 01/14/2021 FINDINGS: Lower chest: The lung bases are grossly clear. No pulmonary lesions or pleural effusions. No pericardial effusion. Hepatobiliary: No hepatic lesions or intrahepatic biliary dilatation. The gallbladder has innumerable small layering gallstones. No gallbladder wall thickening or pericholecystic fluid or pericholecystic inflammatory changes to suggest acute cholecystitis. There is common bile duct dilatation as noted on the ultrasound. In the porta hepatis and measures 10 mm and in the head of the pancreas and measures a maximum 9.5 mm. It does taper normally toward the ampulla. There are at least 5 small calculi in the distal common bile duct collectively likely causing obstruction. Pancreas:  No mass, inflammation or ductal dilatation. Spleen:  Normal size.  No focal lesions. Adrenals/Urinary Tract:  The adrenal glands and kidneys are normal. Stomach/Bowel: The stomach, duodenum, visualized small bowel and visualized colon are grossly normal. Vascular/Lymphatic: The aorta and branch vessels are patent. The major venous structures are patent. No mesenteric or retroperitoneal mass or adenopathy. Other:  No ascites or abdominal hernia. Musculoskeletal: No significant bony findings. IMPRESSION: 1. Cholelithiasis but no MR findings to suggest acute cholecystitis. 2. Dilated common bile duct and choledocholithiasis with at least 5 small calculi in the distal common bile duct. 3. No other significant abdominal findings, mass lesions or adenopathy. Electronically Signed   By: Rudie MeyerP.  Gallerani M.D.   On: 01/14/2021 15:28   US Abdomen Limited RUQ (LIVER/GB)  Result Date: 01/14/2021 CLINICAL DATA:  45 year old female with 5 days of upper abdominal pain. EXAM: ULTRASOUND ABDOMEN LIMITED RIGHT  UPPER QUADRANT COMPARISON:  CT Abdomen and Pelvis 01/10/2021. FINDINGS: Gallbladder: Evidence of small shadowing gallstones (images 8 and 39), individually estimated up to 12 mm. Probable superimposed sludge. Gallbladder wall thickness remains normal at 2-3 mm. No sonographic Murphy sign elicited. No pericholecystic fluid. Common bile duct: Diameter: 11-12 mm, dilated and increased from the recent CT (5-6 mm at that time). No filling defect in the visible duct (image 52). Liver: Echogenic liver (image 54). No discrete liver lesion. No definite intrahepatic ductal dilatation. Portal vein is patent on color Doppler imaging with normal direction of blood flow towards the liver. Other: Negative visible right kidney. IMPRESSION: 1. Dilated CBD (11 mm), new since the CT 4 days ago and highly suspicious for Choledocholithiasis in the setting of small gallstones (#2). 2. Cholelithiasis and sludge but no evidence of acute cholecystitis. 3. Hepatic steatosis. Electronically Signed   By: Odessa FlemingH  Hall M.D.   On: 01/14/2021 09:07        Scheduled Meds:  glucagon (human recombinant)       heparin  5,000 Units Subcutaneous Q8H   pantoprazole (PROTONIX) IV  40 mg Intravenous Q24H   Continuous Infusions:  sodium chloride Stopped (01/14/21 2007)   0.9 % NaCl with KCl 20 mEq / L 75 mL/hr at 01/15/21 1345   ceFAZolin     diphenhydrAMINE     sodium chloride       LOS: 1 day    Time spent: 35 minutes    Jeane Cashatt Hoover Brunette, DO [facility]  If 7PM-7AM, please contact night-coverage [email or web address] 01/15/2021, 2:26 PM

## 2021-01-16 ENCOUNTER — Encounter (HOSPITAL_COMMUNITY): Payer: Self-pay | Admitting: Internal Medicine

## 2021-01-16 ENCOUNTER — Telehealth: Payer: Self-pay | Admitting: Gastroenterology

## 2021-01-16 DIAGNOSIS — K8061 Calculus of gallbladder and bile duct with cholecystitis, unspecified, with obstruction: Secondary | ICD-10-CM

## 2021-01-16 DIAGNOSIS — K8050 Calculus of bile duct without cholangitis or cholecystitis without obstruction: Secondary | ICD-10-CM | POA: Diagnosis not present

## 2021-01-16 DIAGNOSIS — R7989 Other specified abnormal findings of blood chemistry: Secondary | ICD-10-CM | POA: Diagnosis not present

## 2021-01-16 LAB — COMPREHENSIVE METABOLIC PANEL
ALT: 102 U/L — ABNORMAL HIGH (ref 0–44)
AST: 86 U/L — ABNORMAL HIGH (ref 15–41)
Albumin: 3 g/dL — ABNORMAL LOW (ref 3.5–5.0)
Alkaline Phosphatase: 344 U/L — ABNORMAL HIGH (ref 38–126)
Anion gap: 7 (ref 5–15)
BUN: 7 mg/dL (ref 6–20)
CO2: 24 mmol/L (ref 22–32)
Calcium: 8.8 mg/dL — ABNORMAL LOW (ref 8.9–10.3)
Chloride: 106 mmol/L (ref 98–111)
Creatinine, Ser: 0.59 mg/dL (ref 0.44–1.00)
GFR, Estimated: >60 mL/min (ref >60)
Glucose, Bld: 108 mg/dL — ABNORMAL HIGH (ref 70–99)
Potassium: 3.5 mmol/L (ref 3.5–5.1)
Sodium: 137 mmol/L (ref 135–145)
Total Bilirubin: 0.7 mg/dL (ref 0.3–1.2)
Total Protein: 6.2 g/dL — ABNORMAL LOW (ref 6.5–8.1)

## 2021-01-16 LAB — CBC
HCT: 35.3 % — ABNORMAL LOW (ref 36.0–46.0)
Hemoglobin: 11.4 g/dL — ABNORMAL LOW (ref 12.0–15.0)
MCH: 30 pg (ref 26.0–34.0)
MCHC: 32.3 g/dL (ref 30.0–36.0)
MCV: 92.9 fL (ref 80.0–100.0)
Platelets: 299 10*3/uL (ref 150–400)
RBC: 3.8 MIL/uL — ABNORMAL LOW (ref 3.87–5.11)
RDW: 13.2 % (ref 11.5–15.5)
WBC: 7.5 10*3/uL (ref 4.0–10.5)
nRBC: 0 % (ref 0.0–0.2)

## 2021-01-16 LAB — SURGICAL PCR SCREEN
MRSA, PCR: NEGATIVE
Staphylococcus aureus: NEGATIVE

## 2021-01-16 LAB — MAGNESIUM: Magnesium: 2 mg/dL (ref 1.7–2.4)

## 2021-01-16 MED ORDER — MUPIROCIN 2 % EX OINT
1.0000 "application " | TOPICAL_OINTMENT | Freq: Two times a day (BID) | CUTANEOUS | Status: DC
  Administered 2021-01-17: 1 via NASAL
  Filled 2021-01-16: qty 22

## 2021-01-16 MED ORDER — CHLORHEXIDINE GLUCONATE CLOTH 2 % EX PADS: 6.0000 | MEDICATED_PAD | Freq: Once | CUTANEOUS | Status: DC

## 2021-01-16 MED ORDER — CEFAZOLIN IN SODIUM CHLORIDE 3-0.9 GM/100ML-% IV SOLN
3.0000 g | INTRAVENOUS | Status: AC
Start: 2021-01-17 — End: 2021-01-17
  Administered 2021-01-17: 2 g via INTRAVENOUS
  Filled 2021-01-16: qty 100

## 2021-01-16 NOTE — Telephone Encounter (Signed)
Patient needs LFTs in 2 weeks to make sure return to normal. Dx: choledocholithiasis, abnormal LFTs. Currently inpatient s/p ERCP and on schedule for cholecystectomy with possible discharge tomorrow.

## 2021-01-16 NOTE — H&P (View-Only) (Signed)
Reason for Consult: Cholelithiasis, status post ERCP with stone extraction for choledocholithiasis Referring Physician: Dr. Colen Darling Tiffany Chambers is an 45 y.o. female.  HPI: Patient is a 45 year old white female who presented to [facility] with worsening upper abdominal pain.  She was diagnosed with cholelithiasis as well as choledocholithiasis.  She underwent successful ERCP with stone extraction by Dr. Karilyn Cota yesterday.  She has been referred to surgery for cholecystectomy.  This morning, she feels fine.  She denies any abdominal pain.  Past Medical History:  Diagnosis Date   Arthritis    back, hands   Depression    GERD (gastroesophageal reflux disease)    Headache    not since1/2021   Reflux     Past Surgical History:  Procedure Laterality Date   CESAREAN SECTION     x 3   ESOPHAGOGASTRODUODENOSCOPY N/A 12/09/2020   Procedure: ESOPHAGOGASTRODUODENOSCOPY (EGD);  Surgeon: Corliss Skains, MD;  Location: [facility] OR;  Service: Thoracic;  Laterality: N/A;   HEEL SPUR RESECTION Right 08/14/2020   Procedure: POSSIBLE HEEL SPUR RESECTION;  Surgeon: Park Liter, DPM;  Location: [facility] ORS;  Service: Podiatry;  Laterality: Right;   HERNIA REPAIR     PLANTAR FASCIA RELEASE Right 08/14/2020   Procedure: ENDOSCOPIC PLANTAR FASCIOTOMY;  Surgeon: Park Liter, DPM;  Location: [facility] ORS;  Service: Podiatry;  Laterality: Right;   XI ROBOTIC ASSISTED HIATAL HERNIA REPAIR N/A 12/09/2020   Procedure: XI ROBOTIC ASSISTED HIATAL HERNIA REPAIR;  Surgeon: Corliss Skains, MD;  Location: [facility] OR;  Service: Thoracic;  Laterality: N/A;  EGD required    Family History  Problem Relation Age of Onset   Congestive Heart Failure Mother        NO MI, CAD, CABG per pt    Social History:  reports that she has quit smoking. Her smoking use included cigarettes. She has never used smokeless tobacco. She reports current alcohol use. She reports that she does not use drugs.  Allergies:  Allergies   Allergen Reactions   Iodine Itching   Morphine And Related Itching    Medications: I have reviewed the patient's current medications.  Results for orders placed or performed during the hospital encounter of 01/14/21 (from the past 48 hour(s))  Resp Panel by RT-PCR (Flu A&B, Covid) Nasopharyngeal Swab     Status: None   Collection Time: 01/14/21 10:40 AM   Specimen: Nasopharyngeal Swab; Nasopharyngeal(NP) swabs in vial transport medium  Result Value Ref Range   SARS Coronavirus 2 by RT PCR NEGATIVE NEGATIVE    Comment: (NOTE) SARS-CoV-2 target nucleic acids are NOT DETECTED.  The SARS-CoV-2 RNA is generally detectable in upper respiratory specimens during the acute phase of infection. The lowest concentration of SARS-CoV-2 viral copies this assay can detect is 138 copies/mL. A negative result does not preclude SARS-Cov-2 infection and should not be used as the sole basis for treatment or other patient management decisions. A negative result may occur with  improper specimen collection/handling, submission of specimen other than nasopharyngeal swab, presence of viral mutation(s) within the areas targeted by this assay, and inadequate number of viral copies(<138 copies/mL). A negative result must be combined with clinical observations, patient history, and epidemiological information. The expected result is Negative.  Fact Sheet for Patients:  BloggerCourse.com  Fact Sheet for Healthcare Providers:  SeriousBroker.it  This test is no t yet approved or cleared by the [place] FDA and  has been authorized for detection and/or diagnosis of SARS-CoV-2 by FDA under  an Emergency Use Authorization (EUA). This EUA will remain  in effect (meaning this test can be used) for the duration of the COVID-19 declaration under Section 564(b)(1) of the Act, 21 U.S.C.section 360bbb-3(b)(1), unless the authorization is terminated  or revoked  sooner.       Influenza A by PCR NEGATIVE NEGATIVE   Influenza B by PCR NEGATIVE NEGATIVE    Comment: (NOTE) The Xpert Xpress SARS-CoV-2/FLU/RSV plus assay is intended as an aid in the diagnosis of influenza from Nasopharyngeal swab specimens and should not be used as a sole basis for treatment. Nasal washings and aspirates are unacceptable for Xpert Xpress SARS-CoV-2/FLU/RSV testing.  Fact Sheet for Patients: https://www.fda.gov/media/152166/download  Fact Sheet for Healthcare Providers: https://www.fda.gov/media/152162/download  This test is not yet approved or cleared by the [place] FDA and has been authorized for detection and/or diagnosis of SARS-CoV-2 by FDA under an Emergency Use Authorization (EUA). This EUA will remain in effect (meaning this test can be used) for the duration of the COVID-19 declaration under Section 564(b)(1) of the Act, 21 U.S.C. section 360bbb-3(b)(1), unless the authorization is terminated or revoked.  Performed at [facility], [address] [place], [place] [address]   Comprehensive metabolic panel     Status: Abnormal   Collection Time: 01/15/21  5:10 AM  Result Value Ref Range   Sodium 135 135 - 145 mmol/L   Potassium 4.0 3.5 - 5.1 mmol/L   Chloride 102 98 - 111 mmol/L   CO2 25 22 - 32 mmol/L   Glucose, Bld 102 (H) 70 - 99 mg/dL    Comment: Glucose reference range applies only to samples taken after fasting for at least 8 hours.   BUN 9 6 - 20 mg/dL   Creatinine, Ser 0.70 0.44 - 1.00 mg/dL   Calcium 8.6 (L) 8.9 - 10.3 mg/dL   Total Protein 6.5 6.5 - 8.1 g/dL   Albumin 3.2 (L) 3.5 - 5.0 g/dL   AST 158 (H) 15 - 41 U/L   ALT 137 (H) 0 - 44 U/L   Alkaline Phosphatase 427 (H) 38 - 126 U/L   Total Bilirubin 1.0 0.3 - 1.2 mg/dL   GFR, Estimated >60 >60 mL/min    Comment: (NOTE) Calculated using the CKD-EPI Creatinine Equation (2021)    Anion gap 8 5 - 15    Comment: Performed at [facility], [address] [place], [place]  [address]  CBC     Status: Abnormal   Collection Time: 01/15/21  5:10 AM  Result Value Ref Range   WBC 6.0 4.0 - 10.5 K/uL   RBC 3.86 (L) 3.87 - 5.11 MIL/uL   Hemoglobin 11.8 (L) 12.0 - 15.0 g/dL   HCT 37.7 36.0 - 46.0 %   MCV 97.7 80.0 - 100.0 fL    Comment: DELTA CHECK NOTED   MCH 30.6 26.0 - 34.0 pg   MCHC 31.3 30.0 - 36.0 g/dL   RDW 13.2 11.5 - 15.5 %   Platelets 288 150 - 400 K/uL   nRBC 0.0 0.0 - 0.2 %    Comment: Performed at [facility], [address] [place], [place] [address]  Protime-INR     Status: None   Collection Time: 01/15/21  5:10 AM  Result Value Ref Range   Prothrombin Time 12.9 11.4 - 15.2 seconds   INR 1.0 0.8 - 1.2    Comment: (NOTE) INR goal varies based on device and disease states. Performed at [facility], [address] , [place]   [address]   CBC     Status: Abnormal   Collection Time: 01/16/21  4:45 AM  Result Value Ref Range   WBC 7.5 4.0 - 10.5 K/uL   RBC 3.80 (L) 3.87 - 5.11 MIL/uL   Hemoglobin 11.4 (L) 12.0 - 15.0 g/dL   HCT 34.2 (L) 87.6 - 81.1 %   MCV 92.9 80.0 - 100.0 fL   MCH 30.0 26.0 - 34.0 pg   MCHC 32.3 30.0 - 36.0 g/dL   RDW 57.2 62.0 - 35.5 %   Platelets 299 150 - 400 K/uL   nRBC 0.0 0.0 - 0.2 %    Comment: Performed at [facility], [address], [place], [place] [address]  Comprehensive metabolic panel     Status: Abnormal   Collection Time: 01/16/21  4:45 AM  Result Value Ref Range   Sodium 137 135 - 145 mmol/L   Potassium 3.5 3.5 - 5.1 mmol/L   Chloride 106 98 - 111 mmol/L   CO2 24 22 - 32 mmol/L   Glucose, Bld 108 (H) 70 - 99 mg/dL    Comment: Glucose reference range applies only to samples taken after fasting for at least 8 hours.   BUN 7 6 - 20 mg/dL   Creatinine, Ser 3.84 0.44 - 1.00 mg/dL   Calcium 8.8 (L) 8.9 - 10.3 mg/dL   Total Protein 6.2 (L) 6.5 - 8.1 g/dL   Albumin 3.0 (L) 3.5 - 5.0 g/dL   AST 86 (H) 15 - 41 U/L   ALT 102 (H) 0 - 44 U/L   Alkaline Phosphatase 344 (H) 38 - 126 U/L   Total Bilirubin  0.7 0.3 - 1.2 mg/dL   GFR, Estimated >53 >64 mL/min    Comment: (NOTE) Calculated using the CKD-EPI Creatinine Equation (2021)    Anion gap 7 5 - 15    Comment: Performed at [facility], [address], [place], [place] [address]  Magnesium     Status: None   Collection Time: 01/16/21  4:45 AM  Result Value Ref Range   Magnesium 2.0 1.7 - 2.4 mg/dL    Comment: Performed at [facility], [address], [place], [place] [address]    MR 3D Recon At Scanner  Result Date: 01/14/2021 CLINICAL DATA:  Intermittent abdominal pain since last Thursday. Cholelithiasis seen on recent ultrasound. EXAM: MRI ABDOMEN WITHOUT AND WITH CONTRAST (INCLUDING MRCP) TECHNIQUE: Multiplanar multisequence MR imaging of the abdomen was performed both before and after the administration of intravenous contrast. Heavily T2-weighted images of the biliary and pancreatic ducts were obtained, and three-dimensional MRCP images were rendered by post processing. CONTRAST:  10mL GADAVIST GADOBUTROL 1 MMOL/ML IV SOLN COMPARISON:  Abdominal CT scan and 01/10/2021 and abdominal ultrasound 01/14/2021 FINDINGS: Lower chest: The lung bases are grossly clear. No pulmonary lesions or pleural effusions. No pericardial effusion. Hepatobiliary: No hepatic lesions or intrahepatic biliary dilatation. The gallbladder has innumerable small layering gallstones. No gallbladder wall thickening or pericholecystic fluid or pericholecystic inflammatory changes to suggest acute cholecystitis. There is common bile duct dilatation as noted on the ultrasound. In the porta hepatis and measures 10 mm and in the head of the pancreas and measures a maximum 9.5 mm. It does taper normally toward the ampulla. There are at least 5 small calculi in the distal common bile duct collectively likely causing obstruction. Pancreas:  No mass, inflammation or ductal dilatation. Spleen:  Normal size.  No focal lesions. Adrenals/Urinary Tract:  The adrenal glands and kidneys are  normal.  Stomach/Bowel: The stomach, duodenum, visualized small bowel and visualized colon are grossly normal. Vascular/Lymphatic: The aorta and branch vessels are patent. The major venous structures are patent. No mesenteric or retroperitoneal mass or adenopathy. Other:  No ascites or abdominal hernia. Musculoskeletal: No significant bony findings. IMPRESSION: 1. Cholelithiasis but no MR findings to suggest acute cholecystitis. 2. Dilated common bile duct and choledocholithiasis with at least 5 small calculi in the distal common bile duct. 3. No other significant abdominal findings, mass lesions or adenopathy. Electronically Signed   By: Rudie Meyer M.D.   On: 01/14/2021 15:28   DG ERCP  Result Date: 01/15/2021 CLINICAL DATA:  Choledocholithiasis EXAM: ERCP TECHNIQUE: Multiple spot images obtained with the fluoroscopic device and submitted for interpretation post-procedure. FLUOROSCOPY TIME:  Fluoroscopy Time:  1 minutes 30 seconds Radiation Exposure Index (if provided by the fluoroscopic device): 60.5 mGy Number of Acquired Spot Images: 5.  9 cine loops were obtained. COMPARISON:  None. FINDINGS: Submitted ERCP images demonstrate cannulation and opacification of the common bile duct. Cine loop demonstrates balloon sweep of the CBD. No filling defects are identified within the CBD. IMPRESSION: Intraoperative fluoroscopic images of ERCP as above. These images were submitted for radiologic interpretation only. Please see the procedural report for the amount of contrast and the fluoroscopy time utilized. Electronically Signed   By: Acquanetta Belling M.D.   On: 01/15/2021 14:05   MR ABDOMEN MRCP W WO CONTAST  Result Date: 01/14/2021 CLINICAL DATA:  Intermittent abdominal pain since last Thursday. Cholelithiasis seen on recent ultrasound. EXAM: MRI ABDOMEN WITHOUT AND WITH CONTRAST (INCLUDING MRCP) TECHNIQUE: Multiplanar multisequence MR imaging of the abdomen was performed both before and after the administration  of intravenous contrast. Heavily T2-weighted images of the biliary and pancreatic ducts were obtained, and three-dimensional MRCP images were rendered by post processing. CONTRAST:  63mL GADAVIST GADOBUTROL 1 MMOL/ML IV SOLN COMPARISON:  Abdominal CT scan and 01/10/2021 and abdominal ultrasound 01/14/2021 FINDINGS: Lower chest: The lung bases are grossly clear. No pulmonary lesions or pleural effusions. No pericardial effusion. Hepatobiliary: No hepatic lesions or intrahepatic biliary dilatation. The gallbladder has innumerable small layering gallstones. No gallbladder wall thickening or pericholecystic fluid or pericholecystic inflammatory changes to suggest acute cholecystitis. There is common bile duct dilatation as noted on the ultrasound. In the porta hepatis and measures 10 mm and in the head of the pancreas and measures a maximum 9.5 mm. It does taper normally toward the ampulla. There are at least 5 small calculi in the distal common bile duct collectively likely causing obstruction. Pancreas:  No mass, inflammation or ductal dilatation. Spleen:  Normal size.  No focal lesions. Adrenals/Urinary Tract:  The adrenal glands and kidneys are normal. Stomach/Bowel: The stomach, duodenum, visualized small bowel and visualized colon are grossly normal. Vascular/Lymphatic: The aorta and branch vessels are patent. The major venous structures are patent. No mesenteric or retroperitoneal mass or adenopathy. Other:  No ascites or abdominal hernia. Musculoskeletal: No significant bony findings. IMPRESSION: 1. Cholelithiasis but no MR findings to suggest acute cholecystitis. 2. Dilated common bile duct and choledocholithiasis with at least 5 small calculi in the distal common bile duct. 3. No other significant abdominal findings, mass lesions or adenopathy. Electronically Signed   By: Rudie Meyer M.D.   On: 01/14/2021 15:28    ROS:  Pertinent items are noted in HPI.  Blood pressure 116/73, pulse 75, temperature 98.2  F (36.8 C), temperature source Oral, resp. rate 17, height 5\' 4"  (1.626 m), weight 134.1 kg,  SpO2 99 %. Physical Exam: Pleasant white female no acute distress Head is normocephalic, atraumatic Lungs clear to auscultation with equal breath sounds bilaterally Heart examination reveals a regular rate and rhythm without S3, S4, murmurs Abdomen is soft, nontender, nondistended.  Labs and ERCP results reviewed  Assessment/Plan: Impression: Cholelithiasis, status post ERCP with stone extraction for choledocholithiasis.  Patient is stable after her ERCP yesterday. Plan: We will proceed with laparoscopic cholecystectomy tomorrow.  The risks and benefits of the procedure including bleeding, infection, hepatobiliary injury, the possibility of an open procedure were fully explained to the patient, who gave informed consent.  I have transferred the patient to the surgery service.  Franky MachoMark Calistro Rauf 01/16/2021, 9:41 AM

## 2021-01-16 NOTE — Consult Note (Signed)
Reason for Consult: Cholelithiasis, status post ERCP with stone extraction for choledocholithiasis Referring Physician: Dr. Colen Darling Tiffany Chambers is an 45 y.o. female.  HPI: Patient is a 45 year old white female who presented to [facility] with worsening upper abdominal pain.  She was diagnosed with cholelithiasis as well as choledocholithiasis.  She underwent successful ERCP with stone extraction by Dr. Karilyn Cota yesterday.  She has been referred to surgery for cholecystectomy.  This morning, she feels fine.  She denies any abdominal pain.  Past Medical History:  Diagnosis Date   Arthritis    back, hands   Depression    GERD (gastroesophageal reflux disease)    Headache    not since1/2021   Reflux     Past Surgical History:  Procedure Laterality Date   CESAREAN SECTION     x 3   ESOPHAGOGASTRODUODENOSCOPY N/A 12/09/2020   Procedure: ESOPHAGOGASTRODUODENOSCOPY (EGD);  Surgeon: Corliss Skains, MD;  Location: [facility] OR;  Service: Thoracic;  Laterality: N/A;   HEEL SPUR RESECTION Right 08/14/2020   Procedure: POSSIBLE HEEL SPUR RESECTION;  Surgeon: Park Liter, DPM;  Location: [facility] ORS;  Service: Podiatry;  Laterality: Right;   HERNIA REPAIR     PLANTAR FASCIA RELEASE Right 08/14/2020   Procedure: ENDOSCOPIC PLANTAR FASCIOTOMY;  Surgeon: Park Liter, DPM;  Location: [facility] ORS;  Service: Podiatry;  Laterality: Right;   XI ROBOTIC ASSISTED HIATAL HERNIA REPAIR N/A 12/09/2020   Procedure: XI ROBOTIC ASSISTED HIATAL HERNIA REPAIR;  Surgeon: Corliss Skains, MD;  Location: [facility] OR;  Service: Thoracic;  Laterality: N/A;  EGD required    Family History  Problem Relation Age of Onset   Congestive Heart Failure Mother        NO MI, CAD, CABG per pt    Social History:  reports that she has quit smoking. Her smoking use included cigarettes. She has never used smokeless tobacco. She reports current alcohol use. She reports that she does not use drugs.  Allergies:  Allergies   Allergen Reactions   Iodine Itching   Morphine And Related Itching    Medications: I have reviewed the patient's current medications.  Results for orders placed or performed during the hospital encounter of 01/14/21 (from the past 48 hour(s))  Resp Panel by RT-PCR (Flu A&B, Covid) Nasopharyngeal Swab     Status: None   Collection Time: 01/14/21 10:40 AM   Specimen: Nasopharyngeal Swab; Nasopharyngeal(NP) swabs in vial transport medium  Result Value Ref Range   SARS Coronavirus 2 by RT PCR NEGATIVE NEGATIVE    Comment: (NOTE) SARS-CoV-2 target nucleic acids are NOT DETECTED.  The SARS-CoV-2 RNA is generally detectable in upper respiratory specimens during the acute phase of infection. The lowest concentration of SARS-CoV-2 viral copies this assay can detect is 138 copies/mL. A negative result does not preclude SARS-Cov-2 infection and should not be used as the sole basis for treatment or other patient management decisions. A negative result may occur with  improper specimen collection/handling, submission of specimen other than nasopharyngeal swab, presence of viral mutation(s) within the areas targeted by this assay, and inadequate number of viral copies(<138 copies/mL). A negative result must be combined with clinical observations, patient history, and epidemiological information. The expected result is Negative.  Fact Sheet for Patients:  BloggerCourse.com  Fact Sheet for Healthcare Providers:  SeriousBroker.it  This test is no t yet approved or cleared by the [place] FDA and  has been authorized for detection and/or diagnosis of SARS-CoV-2 by FDA under  an Emergency Use Authorization (EUA). This EUA will remain  in effect (meaning this test can be used) for the duration of the COVID-19 declaration under Section 564(b)(1) of the Act, 21 U.S.C.section 360bbb-3(b)(1), unless the authorization is terminated  or revoked  sooner.       Influenza A by PCR NEGATIVE NEGATIVE   Influenza B by PCR NEGATIVE NEGATIVE    Comment: (NOTE) The Xpert Xpress SARS-CoV-2/FLU/RSV plus assay is intended as an aid in the diagnosis of influenza from Nasopharyngeal swab specimens and should not be used as a sole basis for treatment. Nasal washings and aspirates are unacceptable for Xpert Xpress SARS-CoV-2/FLU/RSV testing.  Fact Sheet for Patients: BloggerCourse.com  Fact Sheet for Healthcare Providers: SeriousBroker.it  This test is not yet approved or cleared by the [place] FDA and has been authorized for detection and/or diagnosis of SARS-CoV-2 by FDA under an Emergency Use Authorization (EUA). This EUA will remain in effect (meaning this test can be used) for the duration of the COVID-19 declaration under Section 564(b)(1) of the Act, 21 U.S.C. section 360bbb-3(b)(1), unless the authorization is terminated or revoked.  Performed at [facility], [address] [place], [place] [address]   Comprehensive metabolic panel     Status: Abnormal   Collection Time: 01/15/21  5:10 AM  Result Value Ref Range   Sodium 135 135 - 145 mmol/L   Potassium 4.0 3.5 - 5.1 mmol/L   Chloride 102 98 - 111 mmol/L   CO2 25 22 - 32 mmol/L   Glucose, Bld 102 (H) 70 - 99 mg/dL    Comment: Glucose reference range applies only to samples taken after fasting for at least 8 hours.   BUN 9 6 - 20 mg/dL   Creatinine, Ser 2.95 0.44 - 1.00 mg/dL   Calcium 8.6 (L) 8.9 - 10.3 mg/dL   Total Protein 6.5 6.5 - 8.1 g/dL   Albumin 3.2 (L) 3.5 - 5.0 g/dL   AST 621 (H) 15 - 41 U/L   ALT 137 (H) 0 - 44 U/L   Alkaline Phosphatase 427 (H) 38 - 126 U/L   Total Bilirubin 1.0 0.3 - 1.2 mg/dL   GFR, Estimated >30 >86 mL/min    Comment: (NOTE) Calculated using the CKD-EPI Creatinine Equation (2021)    Anion gap 8 5 - 15    Comment: Performed at [facility], [address] [place], [place]  [address]  CBC     Status: Abnormal   Collection Time: 01/15/21  5:10 AM  Result Value Ref Range   WBC 6.0 4.0 - 10.5 K/uL   RBC 3.86 (L) 3.87 - 5.11 MIL/uL   Hemoglobin 11.8 (L) 12.0 - 15.0 g/dL   HCT 96.2 95.2 - 84.1 %   MCV 97.7 80.0 - 100.0 fL    Comment: DELTA CHECK NOTED   MCH 30.6 26.0 - 34.0 pg   MCHC 31.3 30.0 - 36.0 g/dL   RDW 32.4 40.1 - 02.7 %   Platelets 288 150 - 400 K/uL   nRBC 0.0 0.0 - 0.2 %    Comment: Performed at [facility], [address] [place], [place] [address]  Protime-INR     Status: None   Collection Time: 01/15/21  5:10 AM  Result Value Ref Range   Prothrombin Time 12.9 11.4 - 15.2 seconds   INR 1.0 0.8 - 1.2    Comment: (NOTE) INR goal varies based on device and disease states. Performed at [facility], [address] [place], [place]  [address]   CBC     Status: Abnormal   Collection Time: 01/16/21  4:45 AM  Result Value Ref Range   WBC 7.5 4.0 - 10.5 K/uL   RBC 3.80 (L) 3.87 - 5.11 MIL/uL   Hemoglobin 11.4 (L) 12.0 - 15.0 g/dL   HCT 34.2 (L) 87.6 - 81.1 %   MCV 92.9 80.0 - 100.0 fL   MCH 30.0 26.0 - 34.0 pg   MCHC 32.3 30.0 - 36.0 g/dL   RDW 57.2 62.0 - 35.5 %   Platelets 299 150 - 400 K/uL   nRBC 0.0 0.0 - 0.2 %    Comment: Performed at [facility], [address], [place], [place] [address]  Comprehensive metabolic panel     Status: Abnormal   Collection Time: 01/16/21  4:45 AM  Result Value Ref Range   Sodium 137 135 - 145 mmol/L   Potassium 3.5 3.5 - 5.1 mmol/L   Chloride 106 98 - 111 mmol/L   CO2 24 22 - 32 mmol/L   Glucose, Bld 108 (H) 70 - 99 mg/dL    Comment: Glucose reference range applies only to samples taken after fasting for at least 8 hours.   BUN 7 6 - 20 mg/dL   Creatinine, Ser 3.84 0.44 - 1.00 mg/dL   Calcium 8.8 (L) 8.9 - 10.3 mg/dL   Total Protein 6.2 (L) 6.5 - 8.1 g/dL   Albumin 3.0 (L) 3.5 - 5.0 g/dL   AST 86 (H) 15 - 41 U/L   ALT 102 (H) 0 - 44 U/L   Alkaline Phosphatase 344 (H) 38 - 126 U/L   Total Bilirubin  0.7 0.3 - 1.2 mg/dL   GFR, Estimated >53 >64 mL/min    Comment: (NOTE) Calculated using the CKD-EPI Creatinine Equation (2021)    Anion gap 7 5 - 15    Comment: Performed at [facility], [address], [place], [place] [address]  Magnesium     Status: None   Collection Time: 01/16/21  4:45 AM  Result Value Ref Range   Magnesium 2.0 1.7 - 2.4 mg/dL    Comment: Performed at [facility], [address], [place], [place] [address]    MR 3D Recon At Scanner  Result Date: 01/14/2021 CLINICAL DATA:  Intermittent abdominal pain since last Thursday. Cholelithiasis seen on recent ultrasound. EXAM: MRI ABDOMEN WITHOUT AND WITH CONTRAST (INCLUDING MRCP) TECHNIQUE: Multiplanar multisequence MR imaging of the abdomen was performed both before and after the administration of intravenous contrast. Heavily T2-weighted images of the biliary and pancreatic ducts were obtained, and three-dimensional MRCP images were rendered by post processing. CONTRAST:  10mL GADAVIST GADOBUTROL 1 MMOL/ML IV SOLN COMPARISON:  Abdominal CT scan and 01/10/2021 and abdominal ultrasound 01/14/2021 FINDINGS: Lower chest: The lung bases are grossly clear. No pulmonary lesions or pleural effusions. No pericardial effusion. Hepatobiliary: No hepatic lesions or intrahepatic biliary dilatation. The gallbladder has innumerable small layering gallstones. No gallbladder wall thickening or pericholecystic fluid or pericholecystic inflammatory changes to suggest acute cholecystitis. There is common bile duct dilatation as noted on the ultrasound. In the porta hepatis and measures 10 mm and in the head of the pancreas and measures a maximum 9.5 mm. It does taper normally toward the ampulla. There are at least 5 small calculi in the distal common bile duct collectively likely causing obstruction. Pancreas:  No mass, inflammation or ductal dilatation. Spleen:  Normal size.  No focal lesions. Adrenals/Urinary Tract:  The adrenal glands and kidneys are  normal.  Stomach/Bowel: The stomach, duodenum, visualized small bowel and visualized colon are grossly normal. Vascular/Lymphatic: The aorta and branch vessels are patent. The major venous structures are patent. No mesenteric or retroperitoneal mass or adenopathy. Other:  No ascites or abdominal hernia. Musculoskeletal: No significant bony findings. IMPRESSION: 1. Cholelithiasis but no MR findings to suggest acute cholecystitis. 2. Dilated common bile duct and choledocholithiasis with at least 5 small calculi in the distal common bile duct. 3. No other significant abdominal findings, mass lesions or adenopathy. Electronically Signed   By: Rudie Meyer M.D.   On: 01/14/2021 15:28   DG ERCP  Result Date: 01/15/2021 CLINICAL DATA:  Choledocholithiasis EXAM: ERCP TECHNIQUE: Multiple spot images obtained with the fluoroscopic device and submitted for interpretation post-procedure. FLUOROSCOPY TIME:  Fluoroscopy Time:  1 minutes 30 seconds Radiation Exposure Index (if provided by the fluoroscopic device): 60.5 mGy Number of Acquired Spot Images: 5.  9 cine loops were obtained. COMPARISON:  None. FINDINGS: Submitted ERCP images demonstrate cannulation and opacification of the common bile duct. Cine loop demonstrates balloon sweep of the CBD. No filling defects are identified within the CBD. IMPRESSION: Intraoperative fluoroscopic images of ERCP as above. These images were submitted for radiologic interpretation only. Please see the procedural report for the amount of contrast and the fluoroscopy time utilized. Electronically Signed   By: Acquanetta Belling M.D.   On: 01/15/2021 14:05   MR ABDOMEN MRCP W WO CONTAST  Result Date: 01/14/2021 CLINICAL DATA:  Intermittent abdominal pain since last Thursday. Cholelithiasis seen on recent ultrasound. EXAM: MRI ABDOMEN WITHOUT AND WITH CONTRAST (INCLUDING MRCP) TECHNIQUE: Multiplanar multisequence MR imaging of the abdomen was performed both before and after the administration  of intravenous contrast. Heavily T2-weighted images of the biliary and pancreatic ducts were obtained, and three-dimensional MRCP images were rendered by post processing. CONTRAST:  63mL GADAVIST GADOBUTROL 1 MMOL/ML IV SOLN COMPARISON:  Abdominal CT scan and 01/10/2021 and abdominal ultrasound 01/14/2021 FINDINGS: Lower chest: The lung bases are grossly clear. No pulmonary lesions or pleural effusions. No pericardial effusion. Hepatobiliary: No hepatic lesions or intrahepatic biliary dilatation. The gallbladder has innumerable small layering gallstones. No gallbladder wall thickening or pericholecystic fluid or pericholecystic inflammatory changes to suggest acute cholecystitis. There is common bile duct dilatation as noted on the ultrasound. In the porta hepatis and measures 10 mm and in the head of the pancreas and measures a maximum 9.5 mm. It does taper normally toward the ampulla. There are at least 5 small calculi in the distal common bile duct collectively likely causing obstruction. Pancreas:  No mass, inflammation or ductal dilatation. Spleen:  Normal size.  No focal lesions. Adrenals/Urinary Tract:  The adrenal glands and kidneys are normal. Stomach/Bowel: The stomach, duodenum, visualized small bowel and visualized colon are grossly normal. Vascular/Lymphatic: The aorta and branch vessels are patent. The major venous structures are patent. No mesenteric or retroperitoneal mass or adenopathy. Other:  No ascites or abdominal hernia. Musculoskeletal: No significant bony findings. IMPRESSION: 1. Cholelithiasis but no MR findings to suggest acute cholecystitis. 2. Dilated common bile duct and choledocholithiasis with at least 5 small calculi in the distal common bile duct. 3. No other significant abdominal findings, mass lesions or adenopathy. Electronically Signed   By: Rudie Meyer M.D.   On: 01/14/2021 15:28    ROS:  Pertinent items are noted in HPI.  Blood pressure 116/73, pulse 75, temperature 98.2  F (36.8 C), temperature source Oral, resp. rate 17, height 5\' 4"  (1.626 m), weight 134.1 kg,  SpO2 99 %. Physical Exam: Pleasant white female no acute distress Head is normocephalic, atraumatic Lungs clear to auscultation with equal breath sounds bilaterally Heart examination reveals a regular rate and rhythm without S3, S4, murmurs Abdomen is soft, nontender, nondistended.  Labs and ERCP results reviewed  Assessment/Plan: Impression: Cholelithiasis, status post ERCP with stone extraction for choledocholithiasis.  Patient is stable after her ERCP yesterday. Plan: We will proceed with laparoscopic cholecystectomy tomorrow.  The risks and benefits of the procedure including bleeding, infection, hepatobiliary injury, the possibility of an open procedure were fully explained to the patient, who gave informed consent.  I have transferred the patient to the surgery service.  Franky MachoMark Thekla Colborn 01/16/2021, 9:41 AM

## 2021-01-16 NOTE — Progress Notes (Addendum)
Subjective:  Feels better. Abdominal soreness, ruq/epig. No n/v. Tolerating liquids.   Objective: Vital signs in last 24 hours: Temp:  [97.6 F (36.4 C)-98.6 F (37 C)] 98.2 F (36.8 C) (07/14 0443) Pulse Rate:  [68-79] 75 (07/14 0443) Resp:  [11-20] 17 (07/14 0443) BP: (116-139)/(73-85) 116/73 (07/14 0443) SpO2:  [96 %-99 %] 99 % (07/14 0443) Last BM Date: 01/09/21 General:   Alert,  Well-developed, well-nourished, pleasant and cooperative in NAD Head:  Normocephalic and atraumatic. Eyes:  Sclera clear, no icterus.  Chest: CTA bilaterally without rales, rhonchi, crackles.    Heart:  Regular rate and rhythm; no murmurs, clicks, rubs,  or gallops. Abdomen:  Soft, nontender and nondistended. No masses, hepatosplenomegaly or hernias noted. Normal bowel sounds, without guarding, and without rebound.   Extremities:  Without clubbing, deformity or edema. Neurologic:  Alert and  oriented x4;  grossly normal neurologically. Skin:  Intact without significant lesions or rashes. Psych:  Alert and cooperative. Normal mood and affect.  Intake/Output from previous day: 07/13 0701 - 07/14 0700 In: 2621 [P.O.:480; I.V.:1241; IV Piggyback:900] Out: 0  Intake/Output this shift: No intake/output data recorded.  Lab Results: CBC Recent Labs    01/14/21 0901 01/15/21 0510 01/16/21 0445  WBC 6.1 6.0 7.5  HGB 12.9 11.8* 11.4*  HCT 38.7 37.7 35.3*  MCV 90.4 97.7 92.9  PLT 335 288 299   BMET Recent Labs    01/14/21 0901 01/15/21 0510 01/16/21 0445  NA 133* 135 137  K 3.9 4.0 3.5  CL 101 102 106  CO2 23 25 24   GLUCOSE 98 102* 108*  BUN 9 9 7   CREATININE 0.73 0.70 0.59  CALCIUM 9.0 8.6* 8.8*   LFTs Recent Labs    01/14/21 0901 01/15/21 0510 01/16/21 0445  BILITOT 1.6* 1.0 0.7  ALKPHOS 454* 427* 344*  AST 166* 158* 86*  ALT 137* 137* 102*  PROT 7.1 6.5 6.2*  ALBUMIN 3.5 3.2* 3.0*   Recent Labs    01/14/21 0901  LIPASE 21   PT/INR Recent Labs    01/15/21 0510   LABPROT 12.9  INR 1.0      Imaging Studies: CT ABDOMEN PELVIS W CONTRAST  Result Date: 01/10/2021 CLINICAL DATA:  Epigastric pain history of hernia repair EXAM: CT ABDOMEN AND PELVIS WITH CONTRAST TECHNIQUE: Multidetector CT imaging of the abdomen and pelvis was performed using the standard protocol following bolus administration of intravenous contrast. CONTRAST:  01/17/21 ISOVUE-300 IOPAMIDOL (ISOVUE-300) INJECTION 61% COMPARISON:  Radiograph 07/12/2020, CT 12/07/2020 FINDINGS: Lower chest: Lung bases demonstrate no acute consolidation or effusion. Normal cardiomediastinal silhouette. Small amount of fluid adjacent to the distal esophagus. The patient is status post repair of paraesophageal large hiatal hernia as well as Nissen fundoplication. No recurrent hernia is seen Hepatobiliary: No focal liver abnormality is seen. No gallstones, gallbladder wall thickening, or biliary dilatation. Pancreas: Unremarkable. No pancreatic ductal dilatation or surrounding inflammatory changes. Spleen: Normal in size without focal abnormality. Adrenals/Urinary Tract: Adrenal glands are unremarkable. Kidneys are normal, without renal calculi, focal lesion, or hydronephrosis. Bladder is unremarkable. Stomach/Bowel: Stomach is nondilated. No dilated small bowel. No acute bowel wall thickening. Negative appendix. Vascular/Lymphatic: No significant vascular findings are present. No enlarged abdominal or pelvic lymph nodes. Reproductive: Uterus and bilateral adnexa are unremarkable. Other: Negative for free air or free fluid. Small fat containing umbilical hernia. Musculoskeletal: No acute or significant osseous findings. IMPRESSION: 1. Interval repair of previously noted large para esophageal hiatal hernia without evidence for recurrent hernia. Status post  fundoplication. Small fluid adjacent to the distal esophagus, probably residual postoperative change. Negative for mediastinal air or other acute abnormality. Electronically  Signed   By: Jasmine PangKim  Fujinaga M.D.   On: 01/10/2021 16:20   MR 3D Recon At Scanner  Result Date: 01/14/2021 CLINICAL DATA:  Intermittent abdominal pain since last Thursday. Cholelithiasis seen on recent ultrasound. EXAM: MRI ABDOMEN WITHOUT AND WITH CONTRAST (INCLUDING MRCP) TECHNIQUE: Multiplanar multisequence MR imaging of the abdomen was performed both before and after the administration of intravenous contrast. Heavily T2-weighted images of the biliary and pancreatic ducts were obtained, and three-dimensional MRCP images were rendered by post processing. CONTRAST:  10mL GADAVIST GADOBUTROL 1 MMOL/ML IV SOLN COMPARISON:  Abdominal CT scan and 01/10/2021 and abdominal ultrasound 01/14/2021 FINDINGS: Lower chest: The lung bases are grossly clear. No pulmonary lesions or pleural effusions. No pericardial effusion. Hepatobiliary: No hepatic lesions or intrahepatic biliary dilatation. The gallbladder has innumerable small layering gallstones. No gallbladder wall thickening or pericholecystic fluid or pericholecystic inflammatory changes to suggest acute cholecystitis. There is common bile duct dilatation as noted on the ultrasound. In the porta hepatis and measures 10 mm and in the head of the pancreas and measures a maximum 9.5 mm. It does taper normally toward the ampulla. There are at least 5 small calculi in the distal common bile duct collectively likely causing obstruction. Pancreas:  No mass, inflammation or ductal dilatation. Spleen:  Normal size.  No focal lesions. Adrenals/Urinary Tract:  The adrenal glands and kidneys are normal. Stomach/Bowel: The stomach, duodenum, visualized small bowel and visualized colon are grossly normal. Vascular/Lymphatic: The aorta and branch vessels are patent. The major venous structures are patent. No mesenteric or retroperitoneal mass or adenopathy. Other:  No ascites or abdominal hernia. Musculoskeletal: No significant bony findings. IMPRESSION: 1. Cholelithiasis but no MR  findings to suggest acute cholecystitis. 2. Dilated common bile duct and choledocholithiasis with at least 5 small calculi in the distal common bile duct. 3. No other significant abdominal findings, mass lesions or adenopathy. Electronically Signed   By: Rudie MeyerP.  Gallerani M.D.   On: 01/14/2021 15:28   DG ERCP  Result Date: 01/15/2021 CLINICAL DATA:  Choledocholithiasis EXAM: ERCP TECHNIQUE: Multiple spot images obtained with the fluoroscopic device and submitted for interpretation post-procedure. FLUOROSCOPY TIME:  Fluoroscopy Time:  1 minutes 30 seconds Radiation Exposure Index (if provided by the fluoroscopic device): 60.5 mGy Number of Acquired Spot Images: 5.  9 cine loops were obtained. COMPARISON:  None. FINDINGS: Submitted ERCP images demonstrate cannulation and opacification of the common bile duct. Cine loop demonstrates balloon sweep of the CBD. No filling defects are identified within the CBD. IMPRESSION: Intraoperative fluoroscopic images of ERCP as above. These images were submitted for radiologic interpretation only. Please see the procedural report for the amount of contrast and the fluoroscopy time utilized. Electronically Signed   By: Acquanetta BellingFarhaan  Mir M.D.   On: 01/15/2021 14:05   DG Abdomen Acute W/Chest  Result Date: 01/09/2021 CLINICAL DATA:  Epigastric pain. One episode of emesis. Hernia repair 1 month ago. EXAM: DG ABDOMEN ACUTE WITH 1 VIEW CHEST COMPARISON:  Chest radiograph 12/09/2020, abdominopelvic CT 12/07/2020 FINDINGS: Interval hiatal hernia repair. Heart is normal in size. No focal airspace disease. There may be a small left pleural effusion. No pneumothorax. No free intra-abdominal air. No bowel dilatation to suggest obstruction. Small volume of formed stool. No radiopaque calculi or abnormal soft tissue calcifications. Left pelvic phlebolith. No osseous abnormalities are seen. IMPRESSION: 1. Normal bowel gas pattern. 2. Possible  small left pleural effusion. Electronically Signed   By:  Narda Rutherford M.D.   On: 01/09/2021 16:18   MR ABDOMEN MRCP W WO CONTAST  Result Date: 01/14/2021 CLINICAL DATA:  Intermittent abdominal pain since last Thursday. Cholelithiasis seen on recent ultrasound. EXAM: MRI ABDOMEN WITHOUT AND WITH CONTRAST (INCLUDING MRCP) TECHNIQUE: Multiplanar multisequence MR imaging of the abdomen was performed both before and after the administration of intravenous contrast. Heavily T2-weighted images of the biliary and pancreatic ducts were obtained, and three-dimensional MRCP images were rendered by post processing. CONTRAST:  34mL GADAVIST GADOBUTROL 1 MMOL/ML IV SOLN COMPARISON:  Abdominal CT scan and 01/10/2021 and abdominal ultrasound 01/14/2021 FINDINGS: Lower chest: The lung bases are grossly clear. No pulmonary lesions or pleural effusions. No pericardial effusion. Hepatobiliary: No hepatic lesions or intrahepatic biliary dilatation. The gallbladder has innumerable small layering gallstones. No gallbladder wall thickening or pericholecystic fluid or pericholecystic inflammatory changes to suggest acute cholecystitis. There is common bile duct dilatation as noted on the ultrasound. In the porta hepatis and measures 10 mm and in the head of the pancreas and measures a maximum 9.5 mm. It does taper normally toward the ampulla. There are at least 5 small calculi in the distal common bile duct collectively likely causing obstruction. Pancreas:  No mass, inflammation or ductal dilatation. Spleen:  Normal size.  No focal lesions. Adrenals/Urinary Tract:  The adrenal glands and kidneys are normal. Stomach/Bowel: The stomach, duodenum, visualized small bowel and visualized colon are grossly normal. Vascular/Lymphatic: The aorta and branch vessels are patent. The major venous structures are patent. No mesenteric or retroperitoneal mass or adenopathy. Other:  No ascites or abdominal hernia. Musculoskeletal: No significant bony findings. IMPRESSION: 1. Cholelithiasis but no MR  findings to suggest acute cholecystitis. 2. Dilated common bile duct and choledocholithiasis with at least 5 small calculi in the distal common bile duct. 3. No other significant abdominal findings, mass lesions or adenopathy. Electronically Signed   By: Rudie Meyer M.D.   On: 01/14/2021 15:28   US Abdomen Limited RUQ (LIVER/GB)  Result Date: 01/14/2021 CLINICAL DATA:  45 year old female with 5 days of upper abdominal pain. EXAM: ULTRASOUND ABDOMEN LIMITED RIGHT UPPER QUADRANT COMPARISON:  CT Abdomen and Pelvis 01/10/2021. FINDINGS: Gallbladder: Evidence of small shadowing gallstones (images 8 and 39), individually estimated up to 12 mm. Probable superimposed sludge. Gallbladder wall thickness remains normal at 2-3 mm. No sonographic Murphy sign elicited. No pericholecystic fluid. Common bile duct: Diameter: 11-12 mm, dilated and increased from the recent CT (5-6 mm at that time). No filling defect in the visible duct (image 52). Liver: Echogenic liver (image 54). No discrete liver lesion. No definite intrahepatic ductal dilatation. Portal vein is patent on color Doppler imaging with normal direction of blood flow towards the liver. Other: Negative visible right kidney. IMPRESSION: 1. Dilated CBD (11 mm), new since the CT 4 days ago and highly suspicious for Choledocholithiasis in the setting of small gallstones (#2). 2. Cholelithiasis and sludge but no evidence of acute cholecystitis. 3. Hepatic steatosis. Electronically Signed   By: Odessa Fleming M.D.   On: 01/14/2021 09:07  [2 weeks]   Assessment:  Patient is a 45 year old female with significant history of large hiatal hernia s/p robotic repair 12/09/20, GERD and morbid obesity who presented to ED today with ongoing RUQ pain that began on 7/7 with one episode of blood-tinged emesis that day. Initially had similar RUQ pain prior to [facility] repair in June. She was Found to have elevated LFTs  during ED course. MRCP confirmed 5 small calculi in CBD.  EGD/ERCP 7/13:  partial fundal wrap intact. Mild gastric antral vascular ectasia without bleeding.  Common bile duct and common hepatic duct mildly dilated.  Biliary sphincterotomy performed.  3 small stone spontaneously came out.  No more stones noted on passing balloons of the bile duct.  Clinically feels better this morning.  Mild soreness in the epigastrium, right upper quadrant.  AST down from 156 to 86, ALT down from 137 to 102, alkaline phosphatase down from 427 to 344, total bilirubin normal.  Plan: Follow LFTs back to baseline/normal. Cholecystectomy planned for tomorrow. We will follow peripherally.  Leanna Battles. Dixon Boos [facility] [phone] 7/14/202210:17 AM    LOS: 2 days

## 2021-01-17 ENCOUNTER — Inpatient Hospital Stay (HOSPITAL_COMMUNITY): Payer: Managed Care, Other (non HMO) | Admitting: Registered Nurse

## 2021-01-17 ENCOUNTER — Encounter (HOSPITAL_COMMUNITY): Payer: Self-pay | Admitting: Internal Medicine

## 2021-01-17 ENCOUNTER — Encounter (HOSPITAL_COMMUNITY)
Admission: EM | Disposition: A | Discharge status: Home / Self Care | Payer: Self-pay | Source: Home / Self Care | Attending: General Surgery

## 2021-01-17 DIAGNOSIS — K8050 Calculus of bile duct without cholangitis or cholecystitis without obstruction: Secondary | ICD-10-CM

## 2021-01-17 DIAGNOSIS — K8020 Calculus of gallbladder without cholecystitis without obstruction: Secondary | ICD-10-CM

## 2021-01-17 HISTORY — PX: CHOLECYSTECTOMY: SHX55

## 2021-01-17 SURGERY — LAPAROSCOPIC CHOLECYSTECTOMY
Anesthesia: General | Site: Abdomen

## 2021-01-17 MED ORDER — SODIUM CHLORIDE 0.9 % IR SOLN
Status: DC | PRN
  Administered 2021-01-17: 1000 mL

## 2021-01-17 MED ORDER — SIMETHICONE 80 MG PO CHEW: 40.0000 mg | CHEWABLE_TABLET | Freq: Four times a day (QID) | ORAL | Status: DC | PRN

## 2021-01-17 MED ORDER — TRAMADOL HCL 50 MG PO TABS: 50.0000 mg | ORAL_TABLET | Freq: Four times a day (QID) | ORAL | 0 refills | Status: DC | PRN

## 2021-01-17 MED ORDER — SUGAMMADEX SODIUM 200 MG/2ML IV SOLN
INTRAVENOUS | Status: DC | PRN
  Administered 2021-01-17: 275 mg via INTRAVENOUS

## 2021-01-17 MED ORDER — LACTATED RINGERS IV SOLN: INTRAVENOUS | Status: DC

## 2021-01-17 MED ORDER — ORAL CARE MOUTH RINSE: 15.0000 mL | Freq: Once | OROMUCOSAL | Status: AC

## 2021-01-17 MED ORDER — PROPOFOL 10 MG/ML IV BOLUS
INTRAVENOUS | Status: DC | PRN
  Administered 2021-01-17: 200 mg via INTRAVENOUS

## 2021-01-17 MED ORDER — BUPIVACAINE LIPOSOME 1.3 % IJ SUSP
INTRAMUSCULAR | Status: DC | PRN
  Administered 2021-01-17: 10 mL

## 2021-01-17 MED ORDER — SODIUM CHLORIDE 0.9 % IV SOLN: INTRAVENOUS | Status: DC

## 2021-01-17 MED ORDER — DEXAMETHASONE SODIUM PHOSPHATE 10 MG/ML IJ SOLN
INTRAMUSCULAR | Status: DC | PRN
  Administered 2021-01-17: 5 mg via INTRAVENOUS

## 2021-01-17 MED ORDER — CHLORHEXIDINE GLUCONATE 0.12 % MT SOLN
15.0000 mL | Freq: Once | OROMUCOSAL | Status: AC
  Administered 2021-01-17: 15 mL via OROMUCOSAL

## 2021-01-17 MED ORDER — ROCURONIUM BROMIDE 10 MG/ML (PF) SYRINGE
PREFILLED_SYRINGE | INTRAVENOUS | Status: AC
  Filled 2021-01-17: qty 10

## 2021-01-17 MED ORDER — FENTANYL CITRATE (PF) 100 MCG/2ML IJ SOLN
INTRAMUSCULAR | Status: DC | PRN
  Administered 2021-01-17: 100 ug via INTRAVENOUS
  Administered 2021-01-17 (×3): 50 ug via INTRAVENOUS

## 2021-01-17 MED ORDER — MIDAZOLAM HCL 5 MG/5ML IJ SOLN
INTRAMUSCULAR | Status: DC | PRN
  Administered 2021-01-17: 2 mg via INTRAVENOUS

## 2021-01-17 MED ORDER — ONDANSETRON HCL 4 MG/2ML IJ SOLN: 4.0000 mg | Freq: Once | INTRAMUSCULAR | Status: DC | PRN

## 2021-01-17 MED ORDER — MIDAZOLAM HCL 2 MG/2ML IJ SOLN
INTRAMUSCULAR | Status: AC
  Filled 2021-01-17: qty 2

## 2021-01-17 MED ORDER — LIDOCAINE HCL (PF) 2 % IJ SOLN
INTRAMUSCULAR | Status: AC
  Filled 2021-01-17: qty 10

## 2021-01-17 MED ORDER — FENTANYL CITRATE (PF) 100 MCG/2ML IJ SOLN
25.0000 ug | INTRAMUSCULAR | Status: DC | PRN
  Administered 2021-01-17: 50 ug via INTRAVENOUS
  Administered 2021-01-17 (×2): 25 ug via INTRAVENOUS
  Filled 2021-01-17: qty 2

## 2021-01-17 MED ORDER — ONDANSETRON HCL 4 MG/2ML IJ SOLN
INTRAMUSCULAR | Status: DC | PRN
  Administered 2021-01-17: 4 mg via INTRAVENOUS

## 2021-01-17 MED ORDER — SUCCINYLCHOLINE CHLORIDE 200 MG/10ML IV SOSY
PREFILLED_SYRINGE | INTRAVENOUS | Status: AC
  Filled 2021-01-17: qty 10

## 2021-01-17 MED ORDER — LIDOCAINE HCL (CARDIAC) PF 100 MG/5ML IV SOSY
PREFILLED_SYRINGE | INTRAVENOUS | Status: DC | PRN
  Administered 2021-01-17: 100 mg via INTRAVENOUS

## 2021-01-17 MED ORDER — ENOXAPARIN SODIUM 40 MG/0.4ML IJ SOSY: 40.0000 mg | PREFILLED_SYRINGE | INTRAMUSCULAR | Status: DC

## 2021-01-17 MED ORDER — TRAMADOL HCL 50 MG PO TABS: 50.0000 mg | ORAL_TABLET | Freq: Four times a day (QID) | ORAL | Status: DC | PRN

## 2021-01-17 MED ORDER — BUPIVACAINE LIPOSOME 1.3 % IJ SUSP
INTRAMUSCULAR | Status: AC
  Filled 2021-01-17: qty 20

## 2021-01-17 MED ORDER — FENTANYL CITRATE (PF) 250 MCG/5ML IJ SOLN
INTRAMUSCULAR | Status: AC
  Filled 2021-01-17: qty 5

## 2021-01-17 MED ORDER — DIPHENHYDRAMINE HCL 50 MG/ML IJ SOLN
25.0000 mg | Freq: Four times a day (QID) | INTRAMUSCULAR | Status: DC | PRN
  Administered 2021-01-17: 25 mg via INTRAVENOUS
  Filled 2021-01-17: qty 1

## 2021-01-17 MED ORDER — DIPHENHYDRAMINE HCL 25 MG PO CAPS: 25.0000 mg | ORAL_CAPSULE | Freq: Four times a day (QID) | ORAL | Status: DC | PRN

## 2021-01-17 MED ORDER — ONDANSETRON HCL 4 MG/2ML IJ SOLN
INTRAMUSCULAR | Status: AC
  Filled 2021-01-17: qty 4

## 2021-01-17 MED ORDER — ROCURONIUM BROMIDE 100 MG/10ML IV SOLN
INTRAVENOUS | Status: DC | PRN
  Administered 2021-01-17: 50 mg via INTRAVENOUS

## 2021-01-17 SURGICAL SUPPLY — 46 items
ADH SKN CLS APL DERMABOND .7 (GAUZE/BANDAGES/DRESSINGS) ×2
APL PRP STRL LF DISP 70% ISPRP (MISCELLANEOUS) ×1
APL SRG 38 LTWT LNG FL B (MISCELLANEOUS)
APPLICATOR ARISTA FLEXITIP XL (MISCELLANEOUS) IMPLANT
APPLIER CLIP ROT 10 11.4 M/L (STAPLE) ×2
APR CLP MED LRG 11.4X10 (STAPLE) ×1
BAG RETRIEVAL 10 (BASKET) ×1
CHLORAPREP W/TINT 26 (MISCELLANEOUS) ×2 IMPLANT
CLIP APPLIE ROT 10 11.4 M/L (STAPLE) ×1 IMPLANT
CLOTH BEACON ORANGE TIMEOUT ST (SAFETY) ×2 IMPLANT
COVER LIGHT HANDLE STERIS (MISCELLANEOUS) ×4 IMPLANT
DERMABOND ADVANCED (GAUZE/BANDAGES/DRESSINGS) ×2
DERMABOND ADVANCED .7 DNX12 (GAUZE/BANDAGES/DRESSINGS) ×1 IMPLANT
DRAPE LAPAROSCOPIC ABDOMINAL (DRAPES) ×1 IMPLANT
ELECT REM PT RETURN 9FT ADLT (ELECTROSURGICAL) ×2
ELECTRODE REM PT RTRN 9FT ADLT (ELECTROSURGICAL) ×1 IMPLANT
GLOVE SURG SS PI 7.5 STRL IVOR (GLOVE) ×2 IMPLANT
GLOVE SURG UNDER POLY LF SZ7 (GLOVE) ×4 IMPLANT
GOWN STRL REUS W/TWL LRG LVL3 (GOWN DISPOSABLE) ×6 IMPLANT
HEMOSTAT ARISTA ABSORB 3G PWDR (HEMOSTASIS) IMPLANT
HEMOSTAT SNOW SURGICEL 2X4 (HEMOSTASIS) ×2 IMPLANT
INST SET LAPROSCOPIC AP (KITS) ×2 IMPLANT
KIT TURNOVER KIT A (KITS) ×2 IMPLANT
MANIFOLD NEPTUNE II (INSTRUMENTS) ×2 IMPLANT
NDL HYPO 18GX1.5 BLUNT FILL (NEEDLE) ×1 IMPLANT
NDL HYPO 21X1.5 SAFETY (NEEDLE) ×1 IMPLANT
NDL INSUFFLATION 14GA 120MM (NEEDLE) ×1 IMPLANT
NEEDLE HYPO 18GX1.5 BLUNT FILL (NEEDLE) ×2 IMPLANT
NEEDLE HYPO 21X1.5 SAFETY (NEEDLE) ×2 IMPLANT
NEEDLE INSUFFLATION 14GA 120MM (NEEDLE) ×2 IMPLANT
NS IRRIG 1000ML POUR BTL (IV SOLUTION) ×2 IMPLANT
PACK LAP CHOLE LZT030E (CUSTOM PROCEDURE TRAY) ×2 IMPLANT
PAD ARMBOARD 7.5X6 YLW CONV (MISCELLANEOUS) ×2 IMPLANT
SET BASIN LINEN APH (SET/KITS/TRAYS/PACK) ×2 IMPLANT
SET TUBE SMOKE EVAC HIGH FLOW (TUBING) ×2 IMPLANT
SLEEVE ENDOPATH XCEL 5M (ENDOMECHANICALS) ×2 IMPLANT
SUT MNCRL AB 4-0 PS2 18 (SUTURE) ×4 IMPLANT
SUT VICRYL 0 UR6 27IN ABS (SUTURE) ×2 IMPLANT
SYR 20ML LL LF (SYRINGE) ×4 IMPLANT
SYS BAG RETRIEVAL 10MM (BASKET) ×1
SYSTEM BAG RETRIEVAL 10MM (BASKET) ×1 IMPLANT
TROCAR ENDO BLADELESS 11MM (ENDOMECHANICALS) ×2 IMPLANT
TROCAR XCEL NON-BLD 5MMX100MML (ENDOMECHANICALS) ×2 IMPLANT
TROCAR XCEL UNIV SLVE 11M 100M (ENDOMECHANICALS) ×2 IMPLANT
TUBE CONNECTING 12X1/4 (SUCTIONS) ×2 IMPLANT
WARMER LAPAROSCOPE (MISCELLANEOUS) ×2 IMPLANT

## 2021-01-17 NOTE — Op Note (Signed)
Patient:  Tiffany Chambers  DOB:  10-08-1975  MRN:  [id number]   Preop Diagnosis: Cholelithiasis, status post ERCP for choledocholithiasis  Postop Diagnosis: Same  Procedure: Laparoscopic cholecystectomy  Surgeon: Franky Macho, MD  Assistant: Algis Greenhouse, MD  Anes: General endotracheal  Indications: Patient is a 45 year old white female status post ERCP for stone extraction who now presents for laparoscopic cholecystectomy.  The risks and benefits of the procedure including bleeding, infection, hepatobiliary injury, and the possibility of an open procedure were fully explained to the patient, who gave informed consent.  Procedure note: Patient was placed in the supine position.  After induction of general endotracheal anesthesia, the abdomen was prepped and draped using the usual sterile technique with ChloraPrep.  Surgical site confirmation was performed.  A supraumbilical incision was made down to the fascia.  A Veress needle was introduced into the abdominal cavity and confirmation of placement was done using the saline drop test.  The abdomen was then insufflated to 15 mmHg pressure.  An 11 mm trocar was introduced into the abdominal cavity under direct visualization without difficulty.  The patient was placed in reverse Trendelenburg position and an additional 11 mm trocar was placed in the epigastric region and 5 mm trochars were placed the right upper quadrant and right flank regions.  The liver was inspected noted to be within normal limits.  The gallbladder was retracted in a dynamic fashion in order to provide a critical view of the triangle of Calot.  The cystic duct was first identified.  Its junction to the infundibulum was fully identified.  Endoclips were placed proximally and distally on the cystic duct, and the cystic duct was divided.  This was likewise done of the cystic artery.  The gallbladder was freed away from the gallbladder fossa using Bovie electrocautery.  The  gallbladder was delivered through the epigastric trocar site using Endo Catch bag.  The gallbladder fossa was inspected and no abnormal bleeding or bile leakage was noted.  Surgicel was placed in the gallbladder fossa.  All fluid and air were then evacuated from the abdominal cavity prior to the removal of the trochars.  All wounds were irrigated with normal saline.  All wounds were injected with Exparel.  All incisions were closed using a 4-0 Monocryl subcuticular suture.  Dermabond was applied.  All tape and needle counts were correct at the end the procedure.  Patient was extubated in the operating room and transferred to PACU in stable condition.  Complications: None  EBL: Minimal  Specimen: Gallbladder

## 2021-01-17 NOTE — Interval H&P Note (Signed)
History and Physical Interval Note:  01/17/2021 11:54 AM  Tiffany Chambers  has presented today for surgery, with the diagnosis of cholecystitis, cholelithiasis.  The various methods of treatment have been discussed with the patient and family. After consideration of risks, benefits and other options for treatment, the patient has consented to  Procedure(s): LAPAROSCOPIC CHOLECYSTECTOMY (N/A) as a surgical intervention.  The patient's history has been reviewed, patient examined, no change in status, stable for surgery.  I have reviewed the patient's chart and labs.  Questions were answered to the patient's satisfaction.     Franky Macho

## 2021-01-17 NOTE — Progress Notes (Addendum)
Patient being transported for surgery via bed

## 2021-01-17 NOTE — Transfer of Care (Signed)
Immediate Anesthesia Transfer of Care Note  Patient: Tiffany Chambers  Procedure(s) Performed: LAPAROSCOPIC CHOLECYSTECTOMY (Abdomen)  Patient Location: PACU  Anesthesia Type:General  Level of Consciousness: awake, alert  and oriented  Airway & Oxygen Therapy: Patient Spontanous Breathing and Patient connected to nasal cannula oxygen  Post-op Assessment: Report given to RN and Post -op Vital signs reviewed and stable  Post vital signs: Reviewed and stable  Last Vitals:  Vitals Value Taken Time  BP    Temp    Pulse 78 01/17/21 1341  Resp 6 01/17/21 1341  SpO2 95 % 01/17/21 1341  Vitals shown include unvalidated device data.  Last Pain:  Vitals:   01/17/21 1145  TempSrc:   PainSc: 0-No pain      Patients Stated Pain Goal: 6 (01/15/21 1017)  Complications: No notable events documented.

## 2021-01-17 NOTE — Anesthesia Postprocedure Evaluation (Signed)
Anesthesia Post Note  Patient: TANEQUA KRETZ  Procedure(s) Performed: LAPAROSCOPIC CHOLECYSTECTOMY (Abdomen)  Patient location during evaluation: Phase II Anesthesia Type: General Level of consciousness: awake Pain management: pain level controlled Vital Signs Assessment: post-procedure vital signs reviewed and stable Respiratory status: spontaneous breathing and respiratory function stable Cardiovascular status: blood pressure returned to baseline and stable Postop Assessment: no headache and no apparent nausea or vomiting Anesthetic complications: no Comments: Late entry   No notable events documented.   Last Vitals:  Vitals:   01/17/21 1445 01/17/21 1516  BP: 115/72 125/77  Pulse: 72 64  Resp: 12 15  Temp:  36.8 C  SpO2: 94% 95%    Last Pain:  Vitals:   01/17/21 1445  TempSrc:   PainSc: 5                  Windell Norfolk

## 2021-01-17 NOTE — Anesthesia Procedure Notes (Signed)
Procedure Name: Intubation Date/Time: 01/17/2021 12:49 PM Performed by: Junious Silk, CRNA Pre-anesthesia Checklist: Patient identified, Emergency Drugs available, Suction available, Patient being monitored and Timeout performed Patient Re-evaluated:Patient Re-evaluated prior to induction Oxygen Delivery Method: Circle system utilized Preoxygenation: Pre-oxygenation with 100% oxygen Induction Type: IV induction Ventilation: Mask ventilation without difficulty Laryngoscope Size: Glidescope and 3 Grade View: Grade I Tube type: Oral Tube size: 7.0 mm Number of attempts: 2 Airway Equipment and Method: Video-laryngoscopy Placement Confirmation: ETT inserted through vocal cords under direct vision, positive ETCO2, breath sounds checked- equal and bilateral and CO2 detector Secured at: 24 cm Tube secured with: Tape Dental Injury: Teeth and Oropharynx as per pre-operative assessment  Difficulty Due To: Difficulty was unanticipated Comments: DL with Miller 2 and grade 3 view due to edematous airway. Easy mask ventilation and Glidescope used for atraumatic intubation

## 2021-01-17 NOTE — Anesthesia Preprocedure Evaluation (Signed)
Anesthesia Evaluation  Patient identified by MRN, date of birth, ID band Patient awake    Reviewed: Allergy & Precautions, H&P , NPO status , Patient's Chart, lab work & pertinent test results, reviewed documented beta blocker date and time   Airway Mallampati: II  TM Distance: >3 FB Neck ROM: full    Dental no notable dental hx.    Pulmonary neg pulmonary ROS, former smoker,    Pulmonary exam normal breath sounds clear to auscultation       Cardiovascular Exercise Tolerance: Good negative cardio ROS   Rhythm:regular Rate:Normal     Neuro/Psych  Headaches, PSYCHIATRIC DISORDERS Depression  Neuromuscular disease    GI/Hepatic Neg liver ROS, GERD  Medicated,  Endo/Other  Morbid obesity  Renal/GU negative Renal ROS  negative genitourinary   Musculoskeletal  (+) Arthritis ,   Abdominal   Peds  Hematology negative hematology ROS (+)   Anesthesia Other Findings   Reproductive/Obstetrics negative OB ROS                             Anesthesia Physical  Anesthesia Plan  ASA: 3  Anesthesia Plan: General and General ETT   Post-op Pain Management:    Induction:   PONV Risk Score and Plan: Ondansetron  Airway Management Planned:   Additional Equipment:   Intra-op Plan:   Post-operative Plan:   Informed Consent: I have reviewed the patients History and Physical, chart, labs and discussed the procedure including the risks, benefits and alternatives for the proposed anesthesia with the patient or authorized representative who has indicated his/her understanding and acceptance.     Dental Advisory Given  Plan Discussed with: CRNA  Anesthesia Plan Comments:         Anesthesia Quick Evaluation

## 2021-01-17 NOTE — Discharge Summary (Signed)
Physician Discharge Summary  Patient ID: Tiffany Chambers MRN: [id number] DOB/AGE: 46/46/1977 46 y.o.  Admit date: 01/14/2021 Discharge date: 01/17/2021  Admission Diagnoses: Cholelithiasis, right upper quadrant abdominal pain, choledocholithiasis  Discharge Diagnoses: Same Active Problems:   GERD (gastroesophageal reflux disease)   Depression   Morbid obesity (HCC)   Choledocholithiasis   Elevated LFTs   Cholelithiasis   Discharged Condition: good  Hospital Course: Patient is a 45 year old white female who recently underwent hiatal hernia surgery in June 2022 who presented to the [facility] emergency room with worsening right upper quadrant abdominal pain.  Patient was noted to have dilated hepatobiliary tree and an MRCP was performed which confirmed choledocholithiasis as well as cholelithiasis.  She was admitted on 01/14/2021.  Dr. Karilyn Cota on 01/15/2021 performed an ERCP with stone extraction.  She tolerated that procedure well.  Her liver enzyme tests continue to normalize.  She then underwent laparoscopic cholecystectomy on 01/17/2021.  She tolerated the procedure well.  Her postoperative course has been unremarkable.  Her diet was advanced without difficulty.  She is being discharged home on 01/17/2021 in good and stable condition.  Consults: GI and general surgery  Treatments: surgery: ERCP with stone extraction on 01/15/2021 Laparoscopic cholecystectomy on 01/17/2021  Discharge Exam: Blood pressure 125/77, pulse 64, temperature 98.3 F (36.8 C), resp. rate 15, height 5\' 4"  (1.626 m), weight 134.1 kg, SpO2 95 %. General appearance: alert, cooperative, and no distress GI: Soft, incisions healing well.  Disposition: Discharge disposition: 01-Home or Self Care       Discharge Instructions     Diet - low sodium heart healthy   Complete by: As directed    Increase activity slowly   Complete by: As directed       Allergies as of 01/17/2021       Reactions    Iodine Itching   Dilaudid [hydromorphone Hcl] Itching   Morphine And Related Itching        Medication List     STOP taking these medications    HYDROcodone-acetaminophen 7.5-325 mg/15 ml solution Commonly known as: HYCET   neomycin-bacitracin-polymyxin ointment Commonly known as: NEOSPORIN       TAKE these medications    citalopram 20 MG tablet Commonly known as: CELEXA Take 20 mg by mouth daily. What changed: Another medication with the same name was removed. Continue taking this medication, and follow the directions you see here.   diphenhydramine-acetaminophen 25-500 MG Tabs tablet Commonly known as: TYLENOL PM Take 1 tablet by mouth at bedtime as needed.   esomeprazole 20 MG capsule Commonly known as: NEXIUM Take 1 capsule (20 mg total) by mouth daily. You can open the capsule and sprinkle the contents into apple sauce   OVER THE COUNTER MEDICATION Take 1 tablet by mouth daily. giambi   traMADol 50 MG tablet Commonly known as: Ultram Take 1 tablet (50 mg total) by mouth every 6 (six) hours as needed.        Follow-up Information     11-03-1977, MD Follow up.   Specialty: General Surgery Why: As needed.  Will call you in two weeks for follow up. Contact information: [address] [place] [address]                 Signed: [phone] 01/17/2021, 4:50 PM

## 2021-01-20 ENCOUNTER — Encounter (HOSPITAL_COMMUNITY): Payer: Self-pay | Admitting: General Surgery

## 2021-01-21 ENCOUNTER — Other Ambulatory Visit (INDEPENDENT_AMBULATORY_CARE_PROVIDER_SITE_OTHER): Payer: Self-pay

## 2021-01-21 DIAGNOSIS — K8050 Calculus of bile duct without cholangitis or cholecystitis without obstruction: Secondary | ICD-10-CM

## 2021-01-21 DIAGNOSIS — R7989 Other specified abnormal findings of blood chemistry: Secondary | ICD-10-CM

## 2021-01-21 LAB — SURGICAL PATHOLOGY

## 2021-01-23 ENCOUNTER — Encounter (HOSPITAL_COMMUNITY): Payer: Self-pay | Admitting: General Surgery

## 2021-01-23 MED ORDER — HEMOSTATIC AGENTS (NO CHARGE) OPTIME
TOPICAL | Status: AC | PRN
  Administered 2021-01-17: 1 via TOPICAL

## 2021-01-30 ENCOUNTER — Other Ambulatory Visit: Payer: Self-pay | Admitting: Thoracic Surgery (Cardiothoracic Vascular Surgery)

## 2021-01-30 DIAGNOSIS — Z9889 Other specified postprocedural states: Secondary | ICD-10-CM

## 2021-01-31 ENCOUNTER — Ambulatory Visit
Admission: RE | Admit: 2021-01-31 | Discharge: 2021-01-31 | Disposition: A | Discharge status: Home / Self Care | Payer: Managed Care, Other (non HMO) | Source: Ambulatory Visit | Attending: Thoracic Surgery (Cardiothoracic Vascular Surgery) | Admitting: Thoracic Surgery (Cardiothoracic Vascular Surgery)

## 2021-01-31 ENCOUNTER — Ambulatory Visit (INDEPENDENT_AMBULATORY_CARE_PROVIDER_SITE_OTHER): Payer: Self-pay | Admitting: Thoracic Surgery (Cardiothoracic Vascular Surgery)

## 2021-01-31 ENCOUNTER — Encounter: Payer: Self-pay | Admitting: Thoracic Surgery (Cardiothoracic Vascular Surgery)

## 2021-01-31 ENCOUNTER — Other Ambulatory Visit: Payer: Self-pay

## 2021-01-31 VITALS — BP 133/83 | HR 96 | Resp 20 | Ht 64.0 in | Wt 292.0 lb

## 2021-01-31 DIAGNOSIS — K449 Diaphragmatic hernia without obstruction or gangrene: Secondary | ICD-10-CM

## 2021-01-31 DIAGNOSIS — Z9889 Other specified postprocedural states: Secondary | ICD-10-CM

## 2021-01-31 NOTE — Progress Notes (Signed)
      [address]       [place] [address]             [phone]        AYANO DOUTHITT [facility] Medical Record #[id number] Date of Birth: [birth year]  Referring: Donita Brooks, MD Primary Care: Donita Brooks, MD Primary Cardiologist:None  Reason for visit:   follow-up  History of Present Illness:     Mrs. Fonseca presents for 1 month follow-up appointment.  Since her last visit she is undergone laparoscopic cholecystectomy which apparently was a source of all of her pain.  She denies any reflux or dysphagia and is no longer taking her Nexium.  She is also not had any new episodes of this right upper quadrant pain.  Physical Exam: BP 133/83   Pulse 96   Resp 20   Ht 5\' 4"  (1.626 m)   Wt 292 lb (132.5 kg)   SpO2 98% Comment: RA  BMI 50.12 kg/m   Alert NAD Easy work of breathing Abdomen ND No peripheral edema   Diagnostic Studies & Laboratory data: CXR: Small left effusion     Assessment / Plan:   45 year old female status post robotic assisted laparoscopy repair of giant hiatal hernia.  Overall doing well.  She is cleared from surgical standpoint however she recently underwent a laparoscopic cholecystectomy and will need clearance from that surgeon as well.  Follow-up as needed.     49 01/31/2021 4:17 PM

## 2021-02-04 ENCOUNTER — Telehealth (INDEPENDENT_AMBULATORY_CARE_PROVIDER_SITE_OTHER): Payer: Managed Care, Other (non HMO) | Admitting: General Surgery

## 2021-02-04 ENCOUNTER — Encounter (INDEPENDENT_AMBULATORY_CARE_PROVIDER_SITE_OTHER): Payer: Self-pay

## 2021-02-04 ENCOUNTER — Encounter: Payer: Self-pay | Admitting: Family Medicine

## 2021-02-04 DIAGNOSIS — Z09 Encounter for follow-up examination after completed treatment for conditions other than malignant neoplasm: Secondary | ICD-10-CM

## 2021-02-04 NOTE — Telephone Encounter (Signed)
Telephone postoperative visit performed with patient.  Patient states she is doing well.  She has also had a follow-up with her cardiothoracic surgeon as she had undergone a hiatal hernia repair previously.  She states her preoperative pain has resolved.  She is pleased with the results.  Her incisions have healed.  She may return to work without restrictions on 02/09/2021.  Follow-up here as needed.  As this was a part of the global surgical fee, this was not a billable visit.  Total telephone time was 2 minutes.

## 2021-02-13 ENCOUNTER — Other Ambulatory Visit: Payer: Self-pay | Admitting: Physician Assistant

## 2021-02-17 LAB — COMPLETE METABOLIC PANEL WITH GFR
AG Ratio: 1.3 (calc) (ref 1.0–2.5)
ALT: 10 U/L (ref 6–29)
AST: 12 U/L (ref 10–35)
Albumin: 3.9 g/dL (ref 3.6–5.1)
Alkaline phosphatase (APISO): 87 U/L (ref 31–125)
BUN: 11 mg/dL (ref 7–25)
CO2: 22 mmol/L (ref 20–32)
Calcium: 9.3 mg/dL (ref 8.6–10.2)
Chloride: 106 mmol/L (ref 98–110)
Creat: 0.72 mg/dL (ref 0.50–0.99)
Globulin: 3 g/dL (calc) (ref 1.9–3.7)
Glucose, Bld: 96 mg/dL (ref 65–99)
Potassium: 4.2 mmol/L (ref 3.5–5.3)
Sodium: 138 mmol/L (ref 135–146)
Total Bilirubin: 0.4 mg/dL (ref 0.2–1.2)
Total Protein: 6.9 g/dL (ref 6.1–8.1)
eGFR: 104 mL/min/{1.73_m2} (ref >=60)

## 2021-05-31 IMAGING — US US BREAST*L* LIMITED INC AXILLA
1 series · 3 of 3 positions shown · non-contrast
Comparison: Previous exam(s).

CLINICAL DATA: 43-year-old female with palpable thickening in both
UPPER breast discovered on clinical examination and pain within the
RETROAREOLAR RIGHT breast.

EXAM:
DIGITAL DIAGNOSTIC BILATERAL MAMMOGRAM WITH CAD AND TOMO
ULTRASOUND BILATERAL BREAST

[Series 1: us breast*left* limited inc axilla · 0.07mm/px · 3 of 3 slices shown]
[im 1/3]
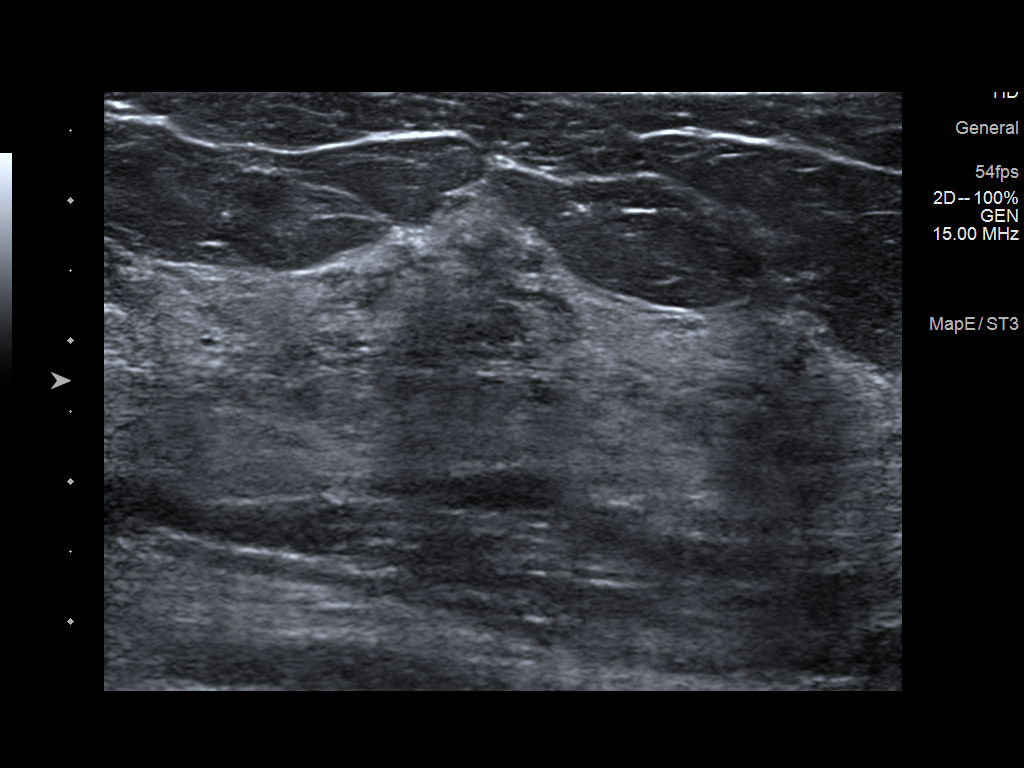
[im 2/3]
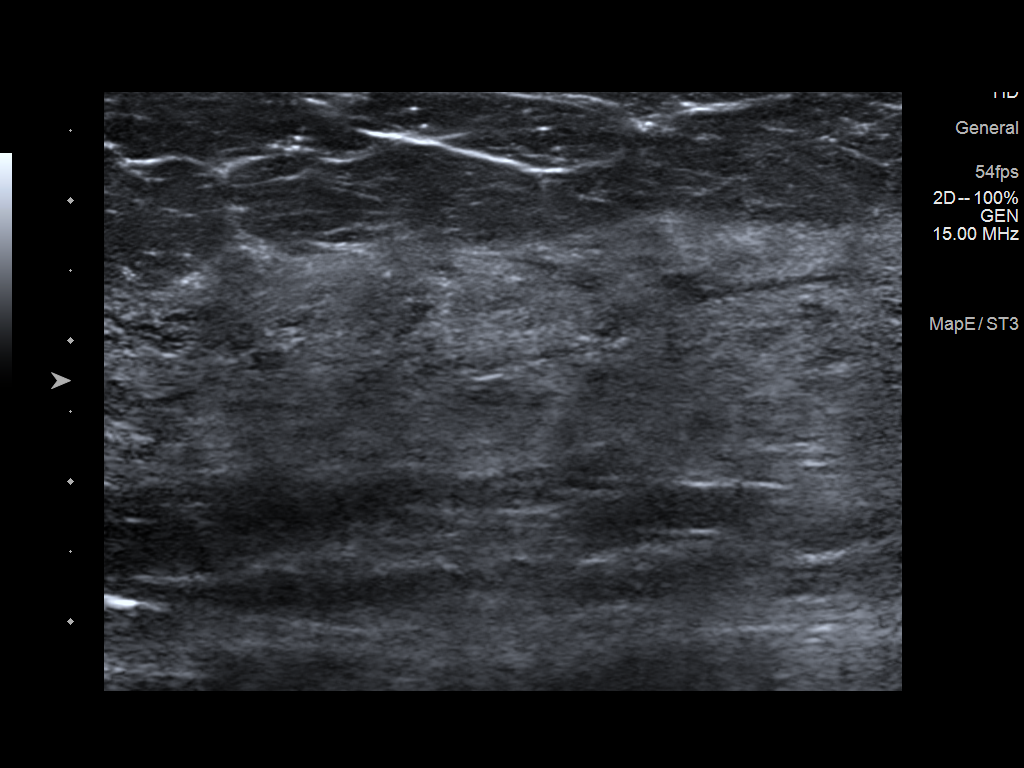
[im 3/3]
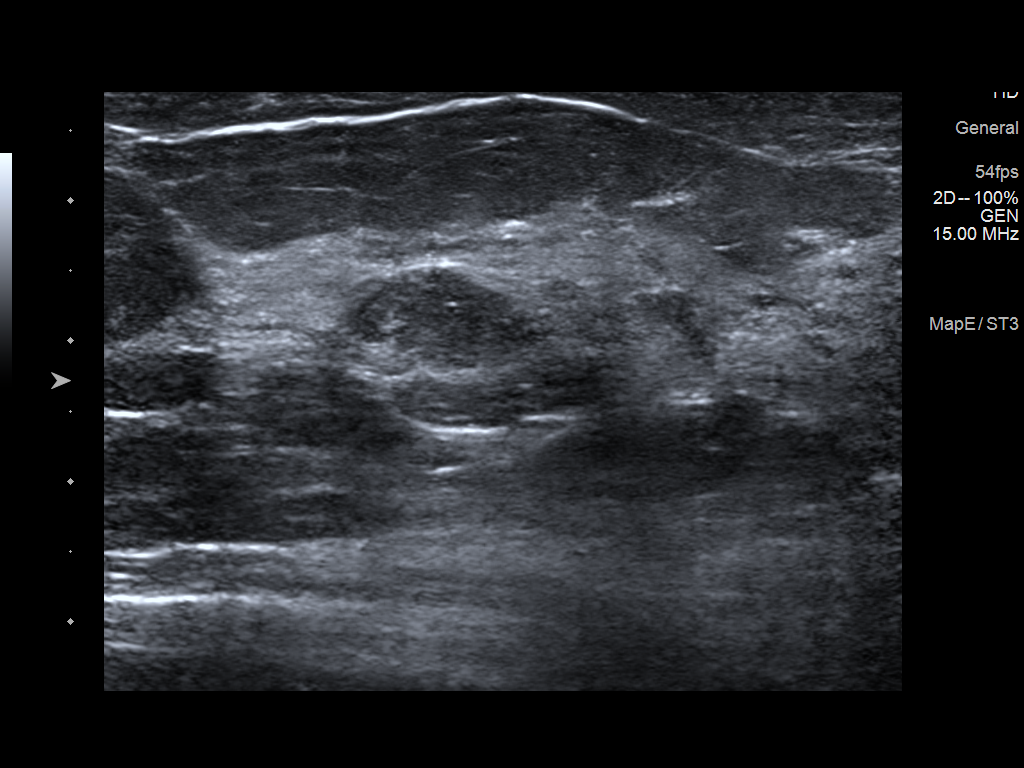

[3 of 3 positions shown; findings below may reference images not displayed]

ACR Breast Density Category d: The breast tissue is extremely dense,
which lowers the sensitivity of mammography.
FINDINGS: 2D/3D full field views of both breasts demonstrate no suspicious
mass, distortion or worrisome calcifications.

Mammographic images were processed with CAD.

Targeted ultrasound is performed, showing normal dense
fibroglandular tissue within both UPPER breasts and in the
RETROAREOLAR RIGHT breast. No solid or cystic mass, distortion or
abnormal shadowing within these regions identified.
IMPRESSION: 1. No mammographic or sonographic abnormalities within the UPPER
breasts, in the areas clinical concern, or within the RETROAREOLAR
RIGHT breast, in the area of patient's pain.
2. No mammographic evidence of breast malignancy.

RECOMMENDATION:
Bilateral screening mammogram in 1 year.

I have discussed the findings and recommendations with the patient.
If applicable, a reminder letter will be sent to the patient
regarding the next appointment.

BI-RADS CATEGORY  1: Negative.

## 2021-08-10 ENCOUNTER — Encounter: Payer: Self-pay | Admitting: Emergency Medicine

## 2021-08-10 ENCOUNTER — Ambulatory Visit
Admission: EM | Admit: 2021-08-10 | Discharge: 2021-08-10 | Disposition: A | Discharge status: Home / Self Care | Payer: Managed Care, Other (non HMO)

## 2021-08-10 ENCOUNTER — Other Ambulatory Visit: Payer: Self-pay

## 2021-08-10 DIAGNOSIS — J018 Other acute sinusitis: Secondary | ICD-10-CM | POA: Diagnosis not present

## 2021-08-10 DIAGNOSIS — R0981 Nasal congestion: Secondary | ICD-10-CM

## 2021-08-10 DIAGNOSIS — R053 Chronic cough: Secondary | ICD-10-CM

## 2021-08-10 MED ORDER — AMOXICILLIN 875 MG PO TABS: 875.0000 mg | ORAL_TABLET | Freq: Two times a day (BID) | ORAL | 0 refills | Status: DC

## 2021-08-10 MED ORDER — CETIRIZINE HCL 10 MG PO TABS: 10.0000 mg | ORAL_TABLET | Freq: Every day | ORAL | 0 refills | Status: DC

## 2021-08-10 MED ORDER — BENZONATATE 100 MG PO CAPS: 100.0000 mg | ORAL_CAPSULE | Freq: Three times a day (TID) | ORAL | 0 refills | Status: DC | PRN

## 2021-08-10 MED ORDER — PROMETHAZINE-DM 6.25-15 MG/5ML PO SYRP: 5.0000 mL | ORAL_SOLUTION | Freq: Every evening | ORAL | 0 refills | Status: DC | PRN

## 2021-08-10 MED ORDER — PSEUDOEPHEDRINE HCL 60 MG PO TABS: 60.0000 mg | ORAL_TABLET | Freq: Three times a day (TID) | ORAL | 0 refills | Status: DC | PRN

## 2021-08-10 NOTE — ED Provider Notes (Signed)
[facility]   MRN: [id number] DOB: 28-May-1976  Subjective:   Tiffany Chambers is a 46 y.o. female presenting for history of persistent congestion, productive cough, sinus pressure, left ear pain.  Cough is worse at night and congestion is also worse when she lays down.  No chest pain, shortness of breath or wheezing.  Has been using Tylenol Cold with other over-the-counter medications.  No history of asthma.  Patient is non-smoker.  No current facility-administered medications for this encounter.  Current Outpatient Medications:    citalopram (CELEXA) 20 MG tablet, Take 20 mg by mouth daily., Disp: , Rfl:    diphenhydramine-acetaminophen (TYLENOL PM) 25-500 MG TABS tablet, Take 1 tablet by mouth at bedtime as needed., Disp: , Rfl:    esomeprazole (NEXIUM) 20 MG capsule, Take 1 capsule (20 mg total) by mouth daily. You can open the capsule and sprinkle the contents into apple sauce, Disp: 30 capsule, Rfl: 1   Multiple Vitamin (MULTIVITAMIN ADULT PO), Take by mouth., Disp: , Rfl:    OVER THE COUNTER MEDICATION, Take 1 tablet by mouth daily. giambi, Disp: , Rfl:    VITAMIN E PO, Take by mouth., Disp: , Rfl:    traMADol (ULTRAM) 50 MG tablet, Take 1 tablet (50 mg total) by mouth every 6 (six) hours as needed. (Patient not taking: Reported on 01/31/2021), Disp: 25 tablet, Rfl: 0  Facility-Administered Medications Ordered in Other Encounters:    hemostatic agents (no charge) Melburn Popper, , , PRN, Franky Macho, MD, 1 application at 01/17/21 1315   Allergies  Allergen Reactions   Iodine Itching   Dilaudid [Hydromorphone Hcl] Itching   Morphine And Related Itching    Past Medical History:  Diagnosis Date   Arthritis    back, hands   Depression    GERD (gastroesophageal reflux disease)    Headache    not since1/2021   Reflux      Past Surgical History:  Procedure Laterality Date   CESAREAN SECTION     x 3   CHOLECYSTECTOMY N/A 01/17/2021   Procedure: LAPAROSCOPIC  CHOLECYSTECTOMY;  Surgeon: Franky Macho, MD;  Location: [facility];  Service: General;  Laterality: N/A;   ERCP N/A 01/15/2021   Procedure: ENDOSCOPIC RETROGRADE CHOLANGIOPANCREATOGRAPHY (ERCP);  Surgeon: Malissa Hippo, MD;  Location: [facility];  Service: Endoscopy;  Laterality: N/A;   ESOPHAGOGASTRODUODENOSCOPY N/A 12/09/2020   Procedure: ESOPHAGOGASTRODUODENOSCOPY (EGD);  Surgeon: Corliss Skains, MD;  Location: [facility] OR;  Service: Thoracic;  Laterality: N/A;   ESOPHAGOGASTRODUODENOSCOPY N/A 01/15/2021   Procedure: ESOPHAGOGASTRODUODENOSCOPY (EGD);  Surgeon: Malissa Hippo, MD;  Location: [facility];  Service: Endoscopy;  Laterality: N/A;   HEEL SPUR RESECTION Right 08/14/2020   Procedure: POSSIBLE HEEL SPUR RESECTION;  Surgeon: Park Liter, DPM;  Location: [facility];  Service: Podiatry;  Laterality: Right;   HERNIA REPAIR     PLANTAR FASCIA RELEASE Right 08/14/2020   Procedure: ENDOSCOPIC PLANTAR FASCIOTOMY;  Surgeon: Park Liter, DPM;  Location: [facility];  Service: Podiatry;  Laterality: Right;   SPHINCTEROTOMY  01/15/2021   Procedure: SPHINCTEROTOMY WITH MILD DILATION;  Surgeon: Malissa Hippo, MD;  Location: [facility];  Service: Endoscopy;;   STONE EXTRACTION WITH BASKET  01/15/2021   Procedure: THREE SMALL STONES BALLOON EXTRACTION;  Surgeon: Malissa Hippo, MD;  Location: [facility];  Service: Endoscopy;;   XI ROBOTIC ASSISTED HIATAL HERNIA REPAIR N/A 12/09/2020   Procedure: XI ROBOTIC ASSISTED HIATAL HERNIA REPAIR;  Surgeon: Corliss Skains, MD;  Location: [facility] OR;  Service: Thoracic;  Laterality: N/A;  EGD required    Family History  Problem Relation Age of Onset   Congestive Heart Failure Mother        NO MI, CAD, CABG per pt    Social History   Tobacco Use   Smoking status: Former    Years: 15.00    Types: Cigarettes   Smokeless tobacco: Never   Tobacco comments:    1 pack lasts one month or more  Vaping Use   Vaping Use: Never used  Substance Use Topics   Alcohol use:  Yes    Comment: social    Drug use: No    ROS   Objective:   Vitals: BP (!) 136/96 (BP Location: Right Arm)    Pulse 92    Temp 98 F (36.7 C) (Oral)    Resp 18    Ht 5\' 4"  (1.626 m)    Wt 292 lb 1.8 oz (132.5 kg)    SpO2 97%    BMI 50.14 kg/m   Physical Exam Constitutional:      General: She is not in acute distress.    Appearance: Normal appearance. She is well-developed and normal weight. She is not ill-appearing, toxic-appearing or diaphoretic.  HENT:     Head: Normocephalic and atraumatic.     Right Ear: Tympanic membrane, ear canal and external ear normal. No drainage or tenderness. No middle ear effusion. There is no impacted cerumen. Tympanic membrane is not erythematous.     Left Ear: Tympanic membrane, ear canal and external ear normal. No drainage or tenderness.  No middle ear effusion. There is no impacted cerumen. Tympanic membrane is not erythematous.     Nose: Congestion present. No rhinorrhea.     Comments: Nasal mucosa boggy and erythematous.    Mouth/Throat:     Mouth: Mucous membranes are moist. No oral lesions.     Pharynx: No pharyngeal swelling, oropharyngeal exudate, posterior oropharyngeal erythema or uvula swelling.     Tonsils: No tonsillar exudate or tonsillar abscesses.  Eyes:     General: No scleral icterus.       Right eye: No discharge.        Left eye: No discharge.     Extraocular Movements: Extraocular movements intact.     Right eye: Normal extraocular motion.     Left eye: Normal extraocular motion.     Conjunctiva/sclera: Conjunctivae normal.  Cardiovascular:     Rate and Rhythm: Normal rate.     Heart sounds: No murmur heard.   No friction rub. No gallop.  Pulmonary:     Effort: Pulmonary effort is normal. No respiratory distress.     Breath sounds: No stridor. No wheezing, rhonchi or rales.  Chest:     Chest wall: No tenderness.  Musculoskeletal:     Cervical back: Normal range of motion and neck supple.  Lymphadenopathy:      Cervical: No cervical adenopathy.  Skin:    General: Skin is warm and dry.  Neurological:     General: No focal deficit present.     Mental Status: She is alert and oriented to person, place, and time.  Psychiatric:        Mood and Affect: Mood normal.        Behavior: Behavior normal.    Assessment and Plan :   PDMP not reviewed this encounter.  1. Acute non-recurrent sinusitis of other sinus   2. Persistent cough   3. Sinus congestion  Deferred imaging given clear cardiopulmonary exam, hemodynamically stable vital signs.  Will start empiric treatment for sinusitis with amoxicillin.  Recommended supportive care otherwise including the use of oral antihistamine, decongestant. Counseled patient on potential for adverse effects with medications prescribed/recommended today, ER and return-to-clinic precautions discussed, patient verbalized understanding.    Jaynee Eagles, [place] 08/11/21 1448

## 2021-08-10 NOTE — ED Triage Notes (Signed)
Patient c/o productive cough, congestion, nasal congestion, left ear pain x 1 month.  Cough is worse when lying down at night and getting up in the mornings.  Patient has taken Tylenol Cold & Sinus Headache.

## 2021-12-16 ENCOUNTER — Ambulatory Visit: Payer: Managed Care, Other (non HMO) | Admitting: Podiatry

## 2021-12-18 ENCOUNTER — Ambulatory Visit: Payer: Managed Care, Other (non HMO) | Admitting: Podiatry

## 2021-12-19 ENCOUNTER — Ambulatory Visit: Payer: Managed Care, Other (non HMO) | Admitting: Podiatry

## 2021-12-19 DIAGNOSIS — R601 Generalized edema: Secondary | ICD-10-CM | POA: Diagnosis not present

## 2021-12-19 DIAGNOSIS — M7751 Other enthesopathy of right foot: Secondary | ICD-10-CM | POA: Diagnosis not present

## 2021-12-19 MED ORDER — MELOXICAM 15 MG PO TABS: 15.0000 mg | ORAL_TABLET | Freq: Every day | ORAL | 0 refills | Status: DC

## 2021-12-23 NOTE — Progress Notes (Signed)
Subjective:  Patient ID: Tiffany Chambers, female    DOB: 19-Nov-1975,  MRN: [id number]  Chief Complaint  Patient presents with   Foot Pain    46 y.o. female presents with the above complaint.  Patient presents with complaint of right ankle pain as well as dorsal foot edema.  Patient states that hurts with ambulation is progressive gotten worse.  She states it came out of nowhere and has gotten more swollen and more painful.  She has not seen anyone else prior to seeing me.  She would like to discuss treatment options for it.  She has not immobilize her foot.   Review of Systems: Negative except as noted in the HPI. Denies N/V/F/Ch.  Past Medical History:  Diagnosis Date   Arthritis    back, hands   Depression    GERD (gastroesophageal reflux disease)    Headache    not since1/2021   Reflux     Current Outpatient Medications:    meloxicam (MOBIC) 15 MG tablet, Take 1 tablet (15 mg total) by mouth daily., Disp: 30 tablet, Rfl: 0   amoxicillin (AMOXIL) 875 MG tablet, Take 1 tablet (875 mg total) by mouth 2 (two) times daily., Disp: 14 tablet, Rfl: 0   benzonatate (TESSALON) 100 MG capsule, Take 1-2 capsules (100-200 mg total) by mouth 3 (three) times daily as needed for cough., Disp: 60 capsule, Rfl: 0   cetirizine (ZYRTEC ALLERGY) 10 MG tablet, Take 1 tablet (10 mg total) by mouth daily., Disp: 30 tablet, Rfl: 0   citalopram (CELEXA) 20 MG tablet, Take 20 mg by mouth daily., Disp: , Rfl:    diphenhydramine-acetaminophen (TYLENOL PM) 25-500 MG TABS tablet, Take 1 tablet by mouth at bedtime as needed., Disp: , Rfl:    esomeprazole (NEXIUM) 20 MG capsule, Take 1 capsule (20 mg total) by mouth daily. You can open the capsule and sprinkle the contents into apple sauce, Disp: 30 capsule, Rfl: 1   Multiple Vitamin (MULTIVITAMIN ADULT PO), Take by mouth., Disp: , Rfl:    OVER THE COUNTER MEDICATION, Take 1 tablet by mouth daily. giambi, Disp: , Rfl:    promethazine-dextromethorphan  (PROMETHAZINE-DM) 6.25-15 MG/5ML syrup, Take 5 mLs by mouth at bedtime as needed for cough., Disp: 100 mL, Rfl: 0   pseudoephedrine (SUDAFED) 60 MG tablet, Take 1 tablet (60 mg total) by mouth every 8 (eight) hours as needed for congestion., Disp: 30 tablet, Rfl: 0   traMADol (ULTRAM) 50 MG tablet, Take 1 tablet (50 mg total) by mouth every 6 (six) hours as needed. (Patient not taking: Reported on 01/31/2021), Disp: 25 tablet, Rfl: 0   VITAMIN E PO, Take by mouth., Disp: , Rfl:  No current facility-administered medications for this visit.  Facility-Administered Medications Ordered in Other Visits:    hemostatic agents (no charge) Melburn Popper, , , PRN, Franky Macho, MD, 1 application  at 01/17/21 1315  Social History   Tobacco Use  Smoking Status Former   Years: 15.00   Types: Cigarettes  Smokeless Tobacco Never  Tobacco Comments   1 pack lasts one month or more    Allergies  Allergen Reactions   Iodine Itching   Dilaudid [Hydromorphone Hcl] Itching   Morphine And Related Itching   Objective:  There were no vitals filed for this visit. There is no height or weight on file to calculate BMI. Constitutional Well developed. Well nourished.  Vascular Dorsalis pedis pulses palpable bilaterally. Posterior tibial pulses palpable bilaterally. Capillary refill normal to all digits.  No cyanosis or clubbing noted. Pedal hair growth normal.  Neurologic Normal speech. Oriented to person, place, and time. Epicritic sensation to light touch grossly present bilaterally.  Dermatologic Nails well groomed and normal in appearance. No open wounds. No skin lesions.  Orthopedic: Pain on palpation right ankle medial lateral gutter.  Mild pain with range of motion of the ankle joint no pain at the posterior tibial tendon peroneal tendon ATFL ligament 2+ pitting edema noted to right dorsal foot.   Radiographs: None Assessment:   1. Capsulitis of ankle, right   2. Generalized edema    Plan:   Patient was evaluated and treated and all questions answered.  Right ankle capsulitis/dorsal foot edema -All questions and concerns were discussed with the patient in extensive detail.  She already has cam boot at home I have asked her to place herself in the boot.  She states understanding. -She will place herself in the cam boot she already has 1 due for next 4 weeks.  She states understanding -Mobic for pain control.  No follow-ups on file.

## 2022-01-15 ENCOUNTER — Other Ambulatory Visit: Payer: Self-pay | Admitting: Podiatry

## 2022-01-16 ENCOUNTER — Ambulatory Visit: Payer: Managed Care, Other (non HMO) | Admitting: Podiatry

## 2022-01-16 DIAGNOSIS — M7751 Other enthesopathy of right foot: Secondary | ICD-10-CM | POA: Diagnosis not present

## 2022-01-23 NOTE — Progress Notes (Signed)
Subjective:  Patient ID: Tiffany Chambers, female    DOB: [birth year],  MRN: [id number]  Chief Complaint  Patient presents with   Plantar Fasciitis    46 y.o. female presents with the above complaint.  Patient presents with complaint of right ankle pain as well as dorsal foot edema.  Patient states that hurts with ambulation is progressive gotten worse.  She states it came out of nowhere and has gotten more swollen and more painful.  She has not seen anyone else prior to seeing me.  She would like to discuss treatment options for it.  She has not immobilize her foot.   Review of Systems: Negative except as noted in the HPI. Denies N/V/F/Ch.  Past Medical History:  Diagnosis Date   Arthritis    back, hands   Depression    GERD (gastroesophageal reflux disease)    Headache    not since1/2021   Reflux     Current Outpatient Medications:    amoxicillin (AMOXIL) 875 MG tablet, Take 1 tablet (875 mg total) by mouth 2 (two) times daily., Disp: 14 tablet, Rfl: 0   benzonatate (TESSALON) 100 MG capsule, Take 1-2 capsules (100-200 mg total) by mouth 3 (three) times daily as needed for cough., Disp: 60 capsule, Rfl: 0   cetirizine (ZYRTEC ALLERGY) 10 MG tablet, Take 1 tablet (10 mg total) by mouth daily., Disp: 30 tablet, Rfl: 0   citalopram (CELEXA) 20 MG tablet, Take 20 mg by mouth daily., Disp: , Rfl:    diphenhydramine-acetaminophen (TYLENOL PM) 25-500 MG TABS tablet, Take 1 tablet by mouth at bedtime as needed., Disp: , Rfl:    esomeprazole (NEXIUM) 20 MG capsule, Take 1 capsule (20 mg total) by mouth daily. You can open the capsule and sprinkle the contents into apple sauce, Disp: 30 capsule, Rfl: 1   meloxicam (MOBIC) 15 MG tablet, TAKE 1 TABLET(15 MG) BY MOUTH DAILY, Disp: 30 tablet, Rfl: 0   Multiple Vitamin (MULTIVITAMIN ADULT PO), Take by mouth., Disp: , Rfl:    OVER THE COUNTER MEDICATION, Take 1 tablet by mouth daily. giambi, Disp: , Rfl:    promethazine-dextromethorphan  (PROMETHAZINE-DM) 6.25-15 MG/5ML syrup, Take 5 mLs by mouth at bedtime as needed for cough., Disp: 100 mL, Rfl: 0   pseudoephedrine (SUDAFED) 60 MG tablet, Take 1 tablet (60 mg total) by mouth every 8 (eight) hours as needed for congestion., Disp: 30 tablet, Rfl: 0   traMADol (ULTRAM) 50 MG tablet, Take 1 tablet (50 mg total) by mouth every 6 (six) hours as needed. (Patient not taking: Reported on 01/31/2021), Disp: 25 tablet, Rfl: 0   VITAMIN E PO, Take by mouth., Disp: , Rfl:  No current facility-administered medications for this visit.  Facility-Administered Medications Ordered in Other Visits:    hemostatic agents (no charge) Melburn Popper, , , PRN, Franky Macho, MD, 1 application  at 01/17/21 1315  Social History   Tobacco Use  Smoking Status Former   Years: 15.00   Types: Cigarettes  Smokeless Tobacco Never  Tobacco Comments   1 pack lasts one month or more    Allergies  Allergen Reactions   Iodine Itching   Dilaudid [Hydromorphone Hcl] Itching   Morphine And Related Itching   Objective:  There were no vitals filed for this visit. There is no height or weight on file to calculate BMI. Constitutional Well developed. Well nourished.  Vascular Dorsalis pedis pulses palpable bilaterally. Posterior tibial pulses palpable bilaterally. Capillary refill normal to all digits.  No cyanosis  or clubbing noted. Pedal hair growth normal.  Neurologic Normal speech. Oriented to person, place, and time. Epicritic sensation to light touch grossly present bilaterally.  Dermatologic Nails well groomed and normal in appearance. No open wounds. No skin lesions.  Orthopedic: Pain on palpation right ankle medial lateral gutter.  Mild pain with range of motion of the ankle joint no pain at the posterior tibial tendon peroneal tendon ATFL ligament 2+ pitting edema noted to right dorsal foot.  Pain on palpation of right fourth metatarsophalangeal joint pain with range of motion of the joint no deep  intra-articular pain noted.  Pain on palpation to the joint.  No extensor or flexor tendinitis noted   Radiographs: None Assessment:   1. Capsulitis of metatarsophalangeal (MTP) joint of right foot   2. Capsulitis of ankle, right     Plan:  Patient was evaluated and treated and all questions answered.  Right ankle capsulitis/dorsal foot edema -All questions and concerns were discussed with the patient in extensive detail. -Clinically her pain has improved a little bit.  She will benefit from transition from cam boot to Tri-Lock ankle brace.  Tri-Lock ankle brace was dispensed.  She will also benefit from steroid injection at decreasing inflammatory component associate with pain. -A steroid injection was performed at right ankle using 1% plain Lidocaine and 10 mg of Kenalog. This was well tolerated.   Right fourth metatarsophalangeal joint capsule -I explained the patient the etiology of capsulitis and was treatment options were discussed.  Given the amount of pain that she is having she will benefit from steroid injection in that area as well.  She states understand would like to proceed with steroid injection A steroid injection was performed at right fourth MTP using 1% plain Lidocaine and 10 mg of Kenalog. This was well tolerated.    No follow-ups on file.

## 2022-02-20 ENCOUNTER — Ambulatory Visit: Payer: Managed Care, Other (non HMO) | Admitting: Podiatry

## 2022-02-20 DIAGNOSIS — M7751 Other enthesopathy of right foot: Secondary | ICD-10-CM

## 2022-02-20 DIAGNOSIS — M722 Plantar fascial fibromatosis: Secondary | ICD-10-CM

## 2022-02-20 NOTE — Progress Notes (Signed)
Subjective:  Patient ID: Tiffany Chambers, female    DOB: 01-16-1976,  MRN: [id number]  Chief Complaint  Patient presents with   Foot Pain    R ankle plain, possible PF     46 y.o. female presents with the above complaint.  Patient presents for follow-up of left fourth MTP joint pain.  Patient states that her of Planter fasciitis is also acting up.  She went to get it evaluated she would like to discuss treatment options for her.  The ankle is doing really good no further pain.   Review of Systems: Negative except as noted in the HPI. Denies N/V/F/Ch.  Past Medical History:  Diagnosis Date   Arthritis    back, hands   Depression    GERD (gastroesophageal reflux disease)    Headache    not since1/2021   Reflux     Current Outpatient Medications:    citalopram (CELEXA) 20 MG tablet, Take 40 mg by mouth daily., Disp: , Rfl:    diphenhydramine-acetaminophen (TYLENOL PM) 25-500 MG TABS tablet, Take 1 tablet by mouth at bedtime as needed., Disp: , Rfl:    meloxicam (MOBIC) 15 MG tablet, TAKE 1 TABLET(15 MG) BY MOUTH DAILY, Disp: 30 tablet, Rfl: 0   Multiple Vitamin (MULTIVITAMIN ADULT PO), Take by mouth., Disp: , Rfl:    OVER THE COUNTER MEDICATION, Take 1 tablet by mouth daily. giambi, Disp: , Rfl:    VITAMIN E PO, Take by mouth., Disp: , Rfl:    amoxicillin (AMOXIL) 875 MG tablet, Take 1 tablet (875 mg total) by mouth 2 (two) times daily., Disp: 14 tablet, Rfl: 0   benzonatate (TESSALON) 100 MG capsule, Take 1-2 capsules (100-200 mg total) by mouth 3 (three) times daily as needed for cough., Disp: 60 capsule, Rfl: 0   cetirizine (ZYRTEC ALLERGY) 10 MG tablet, Take 1 tablet (10 mg total) by mouth daily., Disp: 30 tablet, Rfl: 0   esomeprazole (NEXIUM) 20 MG capsule, Take 1 capsule (20 mg total) by mouth daily. You can open the capsule and sprinkle the contents into apple sauce, Disp: 30 capsule, Rfl: 1   promethazine-dextromethorphan (PROMETHAZINE-DM) 6.25-15 MG/5ML syrup, Take 5  mLs by mouth at bedtime as needed for cough., Disp: 100 mL, Rfl: 0   pseudoephedrine (SUDAFED) 60 MG tablet, Take 1 tablet (60 mg total) by mouth every 8 (eight) hours as needed for congestion., Disp: 30 tablet, Rfl: 0   traMADol (ULTRAM) 50 MG tablet, Take 1 tablet (50 mg total) by mouth every 6 (six) hours as needed. (Patient not taking: Reported on 01/31/2021), Disp: 25 tablet, Rfl: 0 No current facility-administered medications for this visit.  Facility-Administered Medications Ordered in Other Visits:    hemostatic agents (no charge) Melburn Popper, , , PRN, Franky Macho, MD, 1 application  at 01/17/21 1315  Social History   Tobacco Use  Smoking Status Former   Years: 15.00   Types: Cigarettes  Smokeless Tobacco Never  Tobacco Comments   1 pack lasts one month or more    Allergies  Allergen Reactions   Iodine Itching   Dilaudid [Hydromorphone Hcl] Itching   Morphine And Related Itching   Objective:  There were no vitals filed for this visit. There is no height or weight on file to calculate BMI. Constitutional Well developed. Well nourished.  Vascular Dorsalis pedis pulses palpable bilaterally. Posterior tibial pulses palpable bilaterally. Capillary refill normal to all digits.  No cyanosis or clubbing noted. Pedal hair growth normal.  Neurologic Normal speech. Oriented  to person, place, and time. Epicritic sensation to light touch grossly present bilaterally.  Dermatologic Nails well groomed and normal in appearance. No open wounds. No skin lesions.  Orthopedic: No further pain on palpation right ankle medial lateral gutter.  Mild pain with range of motion of the ankle joint no pain at the posterior tibial tendon peroneal tendon ATFL ligament 2+ pitting edema noted to right dorsal foot.  Pain on palpation of right fourth metatarsophalangeal joint pain with range of motion of the joint no deep intra-articular pain noted.  Pain on palpation to the joint.  No extensor or flexor  tendinitis noted  Tender to palpation at the calcaneal tuber right. No pain with calcaneal squeeze right. Ankle ROM diminished range of motion right. Silfverskiold Test: positive right.   Radiographs: None Assessment:   1. Capsulitis of metatarsophalangeal (MTP) joint of right foot   2. Plantar fasciitis of right foot      Plan:  Patient was evaluated and treated and all questions answered.  Right ankle capsulitis/dorsal foot edema -All questions and concerns were discussed with the patient in extensive detail. -Clinically healed and is doing well.  No further injection   Right fourth metatarsophalangeal joint capsule -I explained the patient the etiology of capsulitis and was treatment options were discussed.  Given the amount of pain that she is having she will benefit from steroid injection in that area as well.  She states understand would like to proceed with steroid injection A second steroid injection was performed at right fourth MTP using 1% plain Lidocaine and 10 mg of Kenalog. This was well tolerated.  Plantar Fasciitis, right - XR reviewed as above.  - Educated on icing and stretching. Instructions given.  - Injection delivered to the plantar fascia as below. - DME: Plantar fascial brace dispensed to support the medial longitudinal arch of the foot and offload pressure from the heel and prevent arch collapse during weightbearing - Pharmacologic management: None  Procedure: Injection Tendon/Ligament Location: Right plantar fascia at the glabrous junction; medial approach. Skin Prep: alcohol Injectate: 0.5 cc 0.5% marcaine plain, 0.5 cc of 1% Lidocaine, 0.5 cc kenalog 10. Disposition: Patient tolerated procedure well. Injection site dressed with a band-aid.  No follow-ups on file.  No follow-ups on file.   Second shot in the fourth MTP.  Ankle is doing well.  Plantar fascia is acting up so gave her plantar fascia shot

## 2022-03-02 ENCOUNTER — Ambulatory Visit
Admission: EM | Admit: 2022-03-02 | Discharge: 2022-03-02 | Disposition: A | Discharge status: Home / Self Care | Payer: Managed Care, Other (non HMO) | Attending: Nurse Practitioner | Admitting: Nurse Practitioner

## 2022-03-02 DIAGNOSIS — J069 Acute upper respiratory infection, unspecified: Secondary | ICD-10-CM

## 2022-03-02 DIAGNOSIS — J209 Acute bronchitis, unspecified: Secondary | ICD-10-CM

## 2022-03-02 MED ORDER — PREDNISONE 20 MG PO TABS: 40.0000 mg | ORAL_TABLET | Freq: Every day | ORAL | 0 refills | Status: AC

## 2022-03-02 MED ORDER — FLUTICASONE PROPIONATE 50 MCG/ACT NA SUSP: 2.0000 | Freq: Every day | NASAL | 0 refills | Status: AC

## 2022-03-02 MED ORDER — AMOXICILLIN-POT CLAVULANATE 875-125 MG PO TABS: 1.0000 | ORAL_TABLET | Freq: Two times a day (BID) | ORAL | 0 refills | Status: DC

## 2022-03-02 MED ORDER — PROMETHAZINE-DM 6.25-15 MG/5ML PO SYRP: 5.0000 mL | ORAL_SOLUTION | Freq: Four times a day (QID) | ORAL | 0 refills | Status: DC | PRN

## 2022-03-02 MED ORDER — CETIRIZINE-PSEUDOEPHEDRINE ER 5-120 MG PO TB12: 1.0000 | ORAL_TABLET | Freq: Every day | ORAL | 0 refills | Status: AC

## 2022-03-02 NOTE — ED Provider Notes (Signed)
[facility]    CSN: [id number] Arrival date & time: 03/02/22  1756      History   Chief Complaint Chief Complaint  Patient presents with  . Cough  . Nasal Congestion    HPI JONATHAN DANGEL is a 46 y.o. female.   The history is provided by the patient.   Patient presents for complaints of cough for 2 months with upper respiratory symptoms that started approximately 1 week ago.  Patient complains of nasal and head congestion, and chest congestion.  Patient states that she has not had fever, chills, wheezing, shortness of breath, difficulty breathing, chest pain, or GI symptoms.  Patient states that she also has postnasal drainage that she feels is aggravating her cough.  Patient states cough is sometimes productive of clear sputum. Patient also has a history of reflux.  Patient endorses a history of seasonal allergies.  Past Medical History:  Diagnosis Date  . Arthritis    back, hands  . Depression   . GERD (gastroesophageal reflux disease)   . Headache    not since1/2021  . Reflux     Patient Active Problem List   Diagnosis Date Noted  . Choledocholithiasis 01/14/2021  . Cholelithiasis 01/14/2021  . Upper abdominal pain   . Elevated LFTs   . S/P robot-assisted surgical procedure 12/09/2020  . Hiatal hernia with GERD 12/07/2020  . 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 Heel spur, right   . Morbid obesity ([place]) 07/12/2020  . Plantar fasciitis 07/12/2020  . Alopecia 10/10/2019  . Menorrhagia 10/10/2019  . GERD (gastroesophageal reflux disease) 08/27/2016  . Depression 08/27/2016    Past Surgical History:  Procedure Laterality Date  . CESAREAN SECTION     x 3  . CHOLECYSTECTOMY N/A 01/17/2021   Procedure: LAPAROSCOPIC CHOLECYSTECTOMY;  Surgeon: Aviva Signs, MD;  Location: [facility];  Service: General;  Laterality: N/A;  . ERCP N/A 01/15/2021   Procedure: ENDOSCOPIC RETROGRADE CHOLANGIOPANCREATOGRAPHY (ERCP);  Surgeon: Rogene Houston, MD;   Location: [facility];  Service: Endoscopy;  Laterality: N/A;  . ESOPHAGOGASTRODUODENOSCOPY N/A 12/09/2020   Procedure: ESOPHAGOGASTRODUODENOSCOPY (EGD);  Surgeon: Lajuana Matte, MD;  Location: [facility];  Service: Thoracic;  Laterality: N/A;  . ESOPHAGOGASTRODUODENOSCOPY N/A 01/15/2021   Procedure: ESOPHAGOGASTRODUODENOSCOPY (EGD);  Surgeon: Rogene Houston, MD;  Location: [facility];  Service: Endoscopy;  Laterality: N/A;  . HEEL SPUR RESECTION Right 08/14/2020   Procedure: POSSIBLE HEEL SPUR RESECTION;  Surgeon: Evelina Bucy, DPM;  Location: [facility];  Service: Podiatry;  Laterality: Right;  . HERNIA REPAIR    . PLANTAR FASCIA RELEASE Right 08/14/2020   Procedure: ENDOSCOPIC PLANTAR FASCIOTOMY;  Surgeon: Evelina Bucy, DPM;  Location: [facility];  Service: Podiatry;  Laterality: Right;  . SPHINCTEROTOMY  01/15/2021   Procedure: SPHINCTEROTOMY WITH MILD DILATION;  Surgeon: Rogene Houston, MD;  Location: [facility];  Service: Endoscopy;;  . STONE EXTRACTION WITH BASKET  01/15/2021   Procedure: THREE SMALL STONES BALLOON EXTRACTION;  Surgeon: Rogene Houston, MD;  Location: [facility];  Service: Endoscopy;;  . XI ROBOTIC ASSISTED HIATAL HERNIA REPAIR N/A 12/09/2020   Procedure: XI ROBOTIC ASSISTED HIATAL HERNIA REPAIR;  Surgeon: Lajuana Matte, MD;  Location: [employer];  Service: Thoracic;  Laterality: N/A;  EGD required    OB History   No obstetric history on file.      Home Medications    Prior to Admission medications   Medication Sig Start Date End Date Taking? Authorizing Provider  amoxicillin-clavulanate (  AUGMENTIN) 875-125 MG tablet Take 1 tablet by mouth every 12 (twelve) hours. 03/06/22  Yes Quintell Bonnin-Warren, Alda Lea, NP  cetirizine-pseudoephedrine (ZYRTEC-D) 5-120 MG tablet Take 1 tablet by mouth daily. 03/02/22  Yes Tylia Ewell-Warren, Alda Lea, NP  fluticasone (FLONASE) 50 MCG/ACT nasal spray Place 2 sprays into both nostrils daily. 03/02/22  Yes Kamri Gotsch-Warren, Alda Lea, NP  predniSONE  (DELTASONE) 20 MG tablet Take 2 tablets (40 mg total) by mouth daily with breakfast for 5 days. 03/02/22 03/07/22 Yes Ryane Canavan-Warren, Alda Lea, NP  promethazine-dextromethorphan (PROMETHAZINE-DM) 6.25-15 MG/5ML syrup Take 5 mLs by mouth 4 (four) times daily as needed for cough. 03/02/22  Yes Amanuel Sinkfield-Warren, Alda Lea, NP  amoxicillin (AMOXIL) 875 MG tablet Take 1 tablet (875 mg total) by mouth 2 (two) times daily. 08/10/21   Jaynee Eagles, PA-C  benzonatate (TESSALON) 100 MG capsule Take 1-2 capsules (100-200 mg total) by mouth 3 (three) times daily as needed for cough. 08/10/21   Jaynee Eagles, PA-C  cetirizine (ZYRTEC ALLERGY) 10 MG tablet Take 1 tablet (10 mg total) by mouth daily. 08/10/21   Jaynee Eagles, PA-C  citalopram (CELEXA) 20 MG tablet Take 40 mg by mouth daily. 12/11/20   [provider]  diphenhydramine-acetaminophen (TYLENOL PM) 25-500 MG TABS tablet Take 1 tablet by mouth at bedtime as needed.    [provider]  esomeprazole (NEXIUM) 20 MG capsule Take 1 capsule (20 mg total) by mouth daily. You can open the capsule and sprinkle the contents into apple sauce 12/11/20   Elgie Collard, PA-C  meloxicam (MOBIC) 15 MG tablet TAKE 1 TABLET(15 MG) BY MOUTH DAILY 01/15/22   Felipa Furnace, DPM  Multiple Vitamin (MULTIVITAMIN ADULT PO) Take by mouth.    [provider]  OVER THE COUNTER MEDICATION Take 1 tablet by mouth daily. giambi    [provider]  pseudoephedrine (SUDAFED) 60 MG tablet Take 1 tablet (60 mg total) by mouth every 8 (eight) hours as needed for congestion. 08/10/21   Jaynee Eagles, PA-C  traMADol (ULTRAM) 50 MG tablet Take 1 tablet (50 mg total) by mouth every 6 (six) hours as needed. Patient not taking: Reported on 01/31/2021 01/17/21   Aviva Signs, MD  VITAMIN E PO Take by mouth.    [provider]    Family History Family History  Problem Relation Age of Onset  . Congestive Heart Failure Mother        NO MI, CAD, CABG per pt    Social  History Social History   Tobacco Use  . Smoking status: Former    Years: 15.00    Types: Cigarettes  . Smokeless tobacco: Never  . Tobacco comments:    1 pack lasts one month or more  Vaping Use  . Vaping Use: Never used  Substance Use Topics  . Alcohol use: Yes    Comment: social   . Drug use: No     Allergies   Iodine, Dilaudid [hydromorphone hcl], and Morphine and related   Review of Systems Review of Systems Per HPI  Physical Exam Triage Vital Signs ED Triage Vitals  Enc Vitals Group     BP 03/02/22 1919 (!) 140/86     Pulse Rate 03/02/22 1919 74     Resp 03/02/22 1919 20     Temp 03/02/22 1919 98 F (36.7 C)     Temp src --      SpO2 03/02/22 1919 97 %     Weight --      Height --  Head Circumference --      Peak Flow --      Pain Score 03/02/22 1917 4     Pain Loc --      Pain Edu? --      Excl. in GC? --    No data found.  Updated Vital Signs BP (!) 140/86   Pulse 74   Temp 98 F (36.7 C)   Resp 20   LMP  (LMP Unknown)   SpO2 97%   Visual Acuity Right Eye Distance:   Left Eye Distance:   Bilateral Distance:    Right Eye Near:   Left Eye Near:    Bilateral Near:     Physical Exam Vitals and nursing note reviewed.  Constitutional:      Appearance: She is well-developed.  HENT:     Head: Normocephalic and atraumatic.     Right Ear: Tympanic membrane, ear canal and external ear normal.     Left Ear: Tympanic membrane, ear canal and external ear normal.     Nose: Congestion present.     Mouth/Throat:     Lips: Pink.     Mouth: Mucous membranes are moist.     Pharynx: Posterior oropharyngeal erythema present. No oropharyngeal exudate.     Tonsils: 0 on the right. 1+ on the left.  Eyes:     Conjunctiva/sclera: Conjunctivae normal.     Pupils: Pupils are equal, round, and reactive to light.  Neck:     Thyroid: No thyromegaly.     Trachea: No tracheal deviation.  Cardiovascular:     Rate and Rhythm: Normal rate and regular  rhythm.     Heart sounds: Normal heart sounds.  Pulmonary:     Effort: Pulmonary effort is normal. No respiratory distress.     Breath sounds: Normal breath sounds. No stridor. No wheezing, rhonchi or rales.  Abdominal:     General: Bowel sounds are normal. There is no distension.     Palpations: Abdomen is soft.     Tenderness: There is no abdominal tenderness.  Musculoskeletal:     Cervical back: Normal range of motion.  Lymphadenopathy:     Cervical: No cervical adenopathy.  Skin:    General: Skin is warm and dry.  Neurological:     General: No focal deficit present.     Mental Status: She is alert and oriented to person, place, and time.  Psychiatric:        Behavior: Behavior normal.        Thought Content: Thought content normal.        Judgment: Judgment normal.     UC Treatments / Results  Labs (all labs ordered are listed, but only abnormal results are displayed) Labs Reviewed - No data to display  EKG   Radiology No results found.  Procedures Procedures (including critical care time)  Medications Ordered in UC Medications - No data to display  Initial Impression / Assessment and Plan / UC Course  I have reviewed the triage vital signs and the nursing notes.  Pertinent labs & imaging results that were available during my care of the patient were reviewed by me and considered in my medical decision making (see chart for details).  Patient presents for cough and accompanying upper respiratory symptoms.  Cough has been present for the past 2 months, upper respiratory symptoms started approximately 1 week ago.  On exam, patient's vital signs are stable, she is in no acute distress.  Lung sounds are clear  throughout.  Differential diagnoses include allergic rhinitis, acute bronchitis, and viral upper respiratory infection. Final Clinical Impressions(s) / UC Diagnoses   Final diagnoses:  Acute upper respiratory infection  Acute bronchitis, unspecified organism      Discharge Instructions      Take medication as prescribed. Increase fluids and allow for plenty of rest. Recommend Tylenol or ibuprofen as needed for pain, fever, or general discomfort. Warm salt water gargles 3-4 times daily to help with throat pain or discomfort. As discussed, if your symptoms do not improve by 03/06/2022, pick up your prescription for the antibiotic at your preferred pharmacy. Recommend using a humidifier at bedtime during sleep to help with cough and nasal congestion. Sleep elevated on 2 pillows while cough symptoms persist. Follow-up if your symptoms do not improve      ED Prescriptions     Medication Sig Dispense Auth. Provider   promethazine-dextromethorphan (PROMETHAZINE-DM) 6.25-15 MG/5ML syrup Take 5 mLs by mouth 4 (four) times daily as needed for cough. 140 mL Blu Lori-Warren, Sadie Haber, NP   amoxicillin-clavulanate (AUGMENTIN) 875-125 MG tablet Take 1 tablet by mouth every 12 (twelve) hours. 14 tablet Keenan Trefry-Warren, Sadie Haber, NP   cetirizine-pseudoephedrine (ZYRTEC-D) 5-120 MG tablet Take 1 tablet by mouth daily. 30 tablet Kasara Schomer-Warren, Sadie Haber, NP   fluticasone (FLONASE) 50 MCG/ACT nasal spray Place 2 sprays into both nostrils daily. 16 g Sohum Delillo-Warren, Sadie Haber, NP   predniSONE (DELTASONE) 20 MG tablet Take 2 tablets (40 mg total) by mouth daily with breakfast for 5 days. 10 tablet Antavious Spanos-Warren, Sadie Haber, NP      PDMP not reviewed this encounter.

## 2022-03-02 NOTE — Discharge Instructions (Signed)
Take medication as prescribed. Increase fluids and allow for plenty of rest. Recommend Tylenol or ibuprofen as needed for pain, fever, or general discomfort. Warm salt water gargles 3-4 times daily to help with throat pain or discomfort. As discussed, if your symptoms do not improve by 03/06/2022, pick up your prescription for the antibiotic at your preferred pharmacy. Recommend using a humidifier at bedtime during sleep to help with cough and nasal congestion. Sleep elevated on 2 pillows while cough symptoms persist. Follow-up if your symptoms do not improve

## 2022-03-02 NOTE — ED Triage Notes (Signed)
Pt presents with c/o cough that first developed 2 months ago then developed nasal congestion 1 weeks ago

## 2022-10-07 ENCOUNTER — Telehealth: Payer: Self-pay | Admitting: *Deleted

## 2022-10-07 NOTE — Telephone Encounter (Signed)
Patient contacted the office stating she is experiencing right sided abdominal pain. 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 Patient states she was having abdominal pain prior to hernia repair but is unsure whether pain was related to her PEH or cholecystitis as she had her gallbladder removed shortly after her hernia repair. Patient states she has occasional episodes of "throat tightness". Denies reflux. Advised patient to contact her PCP for further workup of abdominal pain. Advised PCP can refer patient back to Dr. Kipp Brood if needed. Patient verbalized understanding.

## 2022-10-20 ENCOUNTER — Ambulatory Visit: Payer: Managed Care, Other (non HMO) | Admitting: Podiatry

## 2022-10-20 DIAGNOSIS — M722 Plantar fascial fibromatosis: Secondary | ICD-10-CM

## 2022-10-20 DIAGNOSIS — M7751 Other enthesopathy of right foot: Secondary | ICD-10-CM | POA: Diagnosis not present

## 2022-10-20 NOTE — Progress Notes (Unsigned)
Subjective:  Patient ID: Tiffany Chambers, female    DOB: Oct 05, 1975,  MRN: [id number]  Chief Complaint  Patient presents with   Foot Pain    Pt stated that she still has some discomfort    47 y.o. female presents with the above complaint.  Patient presents with complaint of Planter fasciitis to the lower left side as well as right ankle pain.  She states both lower started acting back up again.  She did for work pain again injection   Review of Systems: Negative except as noted in the HPI. Denies N/V/F/Ch.  Past Medical History:  Diagnosis Date   Arthritis    back, hands   Depression    GERD (gastroesophageal reflux disease)    Headache    not since1/2021   Reflux     Current Outpatient Medications:    amoxicillin (AMOXIL) 875 MG tablet, Take 1 tablet (875 mg total) by mouth 2 (two) times daily., Disp: 14 tablet, Rfl: 0   amoxicillin-clavulanate (AUGMENTIN) 875-125 MG tablet, Take 1 tablet by mouth every 12 (twelve) hours., Disp: 14 tablet, Rfl: 0   benzonatate (TESSALON) 100 MG capsule, Take 1-2 capsules (100-200 mg total) by mouth 3 (three) times daily as needed for cough., Disp: 60 capsule, Rfl: 0   cetirizine (ZYRTEC ALLERGY) 10 MG tablet, Take 1 tablet (10 mg total) by mouth daily., Disp: 30 tablet, Rfl: 0   cetirizine-pseudoephedrine (ZYRTEC-D) 5-120 MG tablet, Take 1 tablet by mouth daily., Disp: 30 tablet, Rfl: 0   citalopram (CELEXA) 20 MG tablet, Take 40 mg by mouth daily., Disp: , Rfl:    diphenhydramine-acetaminophen (TYLENOL PM) 25-500 MG TABS tablet, Take 1 tablet by mouth at bedtime as needed., Disp: , Rfl:    esomeprazole (NEXIUM) 20 MG capsule, Take 1 capsule (20 mg total) by mouth daily. You can open the capsule and sprinkle the contents into apple sauce, Disp: 30 capsule, Rfl: 1   fluticasone (FLONASE) 50 MCG/ACT nasal spray, Place 2 sprays into both nostrils daily., Disp: 16 g, Rfl: 0   meloxicam (MOBIC) 15 MG tablet, TAKE 1 TABLET(15 MG) BY MOUTH DAILY,  Disp: 30 tablet, Rfl: 0   Multiple Vitamin (MULTIVITAMIN ADULT PO), Take by mouth., Disp: , Rfl:    OVER THE COUNTER MEDICATION, Take 1 tablet by mouth daily. giambi, Disp: , Rfl:    promethazine-dextromethorphan (PROMETHAZINE-DM) 6.25-15 MG/5ML syrup, Take 5 mLs by mouth 4 (four) times daily as needed for cough., Disp: 140 mL, Rfl: 0   pseudoephedrine (SUDAFED) 60 MG tablet, Take 1 tablet (60 mg total) by mouth every 8 (eight) hours as needed for congestion., Disp: 30 tablet, Rfl: 0   traMADol (ULTRAM) 50 MG tablet, Take 1 tablet (50 mg total) by mouth every 6 (six) hours as needed. (Patient not taking: Reported on 01/31/2021), Disp: 25 tablet, Rfl: 0   VITAMIN E PO, Take by mouth., Disp: , Rfl:  No current facility-administered medications for this visit.  Facility-Administered Medications Ordered in Other Visits:    hemostatic agents (no charge) Melburn Popper, , , PRN, Franky Macho, MD, 1 application  at 01/17/21 1315  Social History   Tobacco Use  Smoking Status Former   Years: 15   Types: Cigarettes  Smokeless Tobacco Never  Tobacco Comments   1 pack lasts one month or more    Allergies  Allergen Reactions   Iodine Itching   Dilaudid [Hydromorphone Hcl] Itching   Morphine And Related Itching   Objective:  There were no vitals filed for  this visit. There is no height or weight on file to calculate BMI. Constitutional Well developed. Well nourished.  Vascular Dorsalis pedis pulses palpable bilaterally. Posterior tibial pulses palpable bilaterally. Capillary refill normal to all digits.  No cyanosis or clubbing noted. Pedal hair growth normal.  Neurologic Normal speech. Oriented to person, place, and time. Epicritic sensation to light touch grossly present bilaterally.  Dermatologic Nails well groomed and normal in appearance. No open wounds. No skin lesions.  Orthopedic: pain on palpation right ankle medial lateral gutter.  Mild pain with range of motion of the ankle joint no  pain at the posterior tibial tendon peroneal tendon ATFL ligament 2+ pitting edema noted to right dorsal foot.    Tender to palpation at the calcaneal tuber left No pain with calcaneal squeeze left Ankle ROM diminished range of motion left Silfverskiold Test: positive right.   Radiographs: None Assessment:   1. Plantar fasciitis of left foot   2. Capsulitis of ankle, right       Plan:  Patient was evaluated and treated and all questions answered.  Right ankle capsulitis/dorsal foot edema -All questions and concerns were discussed with the patient in extensive detail. -Patient did have recurrence of the ankle pain.  She would like to do another injection. A steroid injection was performed at right ankle joint using 1% plain Lidocaine and 10 mg of Kenalog. This was well tolerated.   Plantar Fasciitis, left - XR reviewed as above.  - Educated on icing and stretching. Instructions given.  -Second injection delivered to the plantar fascia as below. - DME: Plantar fascial brace dispensed to support the medial longitudinal arch of the foot and offload pressure from the heel and prevent arch collapse during weightbearing - Pharmacologic management: None  Procedure: Injection Tendon/Ligament Location: Left plantar fascia at the glabrous junction; medial approach. Skin Prep: alcohol Injectate: 0.5 cc 0.5% marcaine plain, 0.5 cc of 1% Lidocaine, 0.5 cc kenalog 10. Disposition: Patient tolerated procedure well. Injection site dressed with a band-aid.  No follow-ups on file.  No follow-ups on file.

## 2022-11-13 ENCOUNTER — Telehealth: Payer: Self-pay | Admitting: Podiatry

## 2022-11-13 NOTE — Telephone Encounter (Signed)
Pt is still experiencing pain in her feet; she has been sched to come in on 5/31. She wanted to know if she can get a Rx refill on Meloxicam until her next appt. Please advise

## 2022-11-16 MED ORDER — MELOXICAM 15 MG PO TABS: 15.0000 mg | ORAL_TABLET | Freq: Every day | ORAL | 0 refills | Status: AC

## 2022-12-04 ENCOUNTER — Ambulatory Visit: Payer: Managed Care, Other (non HMO) | Admitting: Podiatry

## 2022-12-04 DIAGNOSIS — M722 Plantar fascial fibromatosis: Secondary | ICD-10-CM | POA: Diagnosis not present

## 2022-12-04 NOTE — Progress Notes (Signed)
Subjective:  Patient ID: Tiffany Chambers, female    DOB: 15-Jun-1976,  MRN: [id number]  Chief Complaint  Patient presents with   Plantar Fasciitis    47 y.o. female presents with the above complaint.  Patient presents with complaint of Planter fasciitis left side.  Now the right side plantar fascia is acting up denies any other acute complaints   Review of Systems: Negative except as noted in the HPI. Denies N/V/F/Ch.  Past Medical History:  Diagnosis Date   Arthritis    back, hands   Depression    GERD (gastroesophageal reflux disease)    Headache    not since1/2021   Reflux     Current Outpatient Medications:    amoxicillin (AMOXIL) 875 MG tablet, Take 1 tablet (875 mg total) by mouth 2 (two) times daily., Disp: 14 tablet, Rfl: 0   amoxicillin-clavulanate (AUGMENTIN) 875-125 MG tablet, Take 1 tablet by mouth every 12 (twelve) hours., Disp: 14 tablet, Rfl: 0   benzonatate (TESSALON) 100 MG capsule, Take 1-2 capsules (100-200 mg total) by mouth 3 (three) times daily as needed for cough., Disp: 60 capsule, Rfl: 0   cetirizine (ZYRTEC ALLERGY) 10 MG tablet, Take 1 tablet (10 mg total) by mouth daily., Disp: 30 tablet, Rfl: 0   cetirizine-pseudoephedrine (ZYRTEC-D) 5-120 MG tablet, Take 1 tablet by mouth daily., Disp: 30 tablet, Rfl: 0   citalopram (CELEXA) 20 MG tablet, Take 40 mg by mouth daily., Disp: , Rfl:    diphenhydramine-acetaminophen (TYLENOL PM) 25-500 MG TABS tablet, Take 1 tablet by mouth at bedtime as needed., Disp: , Rfl:    esomeprazole (NEXIUM) 20 MG capsule, Take 1 capsule (20 mg total) by mouth daily. You can open the capsule and sprinkle the contents into apple sauce, Disp: 30 capsule, Rfl: 1   fluticasone (FLONASE) 50 MCG/ACT nasal spray, Place 2 sprays into both nostrils daily., Disp: 16 g, Rfl: 0   meloxicam (MOBIC) 15 MG tablet, TAKE 1 TABLET(15 MG) BY MOUTH DAILY, Disp: 30 tablet, Rfl: 0   meloxicam (MOBIC) 15 MG tablet, Take 1 tablet (15 mg total) by  mouth daily., Disp: 30 tablet, Rfl: 0   Multiple Vitamin (MULTIVITAMIN ADULT PO), Take by mouth., Disp: , Rfl:    OVER THE COUNTER MEDICATION, Take 1 tablet by mouth daily. giambi, Disp: , Rfl:    promethazine-dextromethorphan (PROMETHAZINE-DM) 6.25-15 MG/5ML syrup, Take 5 mLs by mouth 4 (four) times daily as needed for cough., Disp: 140 mL, Rfl: 0   pseudoephedrine (SUDAFED) 60 MG tablet, Take 1 tablet (60 mg total) by mouth every 8 (eight) hours as needed for congestion., Disp: 30 tablet, Rfl: 0   traMADol (ULTRAM) 50 MG tablet, Take 1 tablet (50 mg total) by mouth every 6 (six) hours as needed. (Patient not taking: Reported on 01/31/2021), Disp: 25 tablet, Rfl: 0   VITAMIN E PO, Take by mouth., Disp: , Rfl:  No current facility-administered medications for this visit.  Facility-Administered Medications Ordered in Other Visits:    hemostatic agents (no charge) Melburn Popper, , , PRN, Franky Macho, MD, 1 application  at 01/17/21 1315  Social History   Tobacco Use  Smoking Status Former   Years: 15   Types: Cigarettes  Smokeless Tobacco Never  Tobacco Comments   1 pack lasts one month or more    Allergies  Allergen Reactions   Iodine Itching   Dilaudid [Hydromorphone Hcl] Itching   Morphine And Codeine Itching   Objective:  There were no vitals filed for this visit.  There is no height or weight on file to calculate BMI. Constitutional Well developed. Well nourished.  Vascular Dorsalis pedis pulses palpable bilaterally. Posterior tibial pulses palpable bilaterally. Capillary refill normal to all digits.  No cyanosis or clubbing noted. Pedal hair growth normal.  Neurologic Normal speech. Oriented to person, place, and time. Epicritic sensation to light touch grossly present bilaterally.  Dermatologic Nails well groomed and normal in appearance. No open wounds. No skin lesions.  Orthopedic: pain on palpation right ankle medial lateral gutter.  Mild pain with range of motion of the  ankle joint no pain at the posterior tibial tendon peroneal tendon ATFL ligament 2+ pitting edema noted to right dorsal foot.    Tender to palpation at the calcaneal tuber bilateral No pain with calcaneal squeeze bilateral Ankle ROM diminished range of motion bilateral Silfverskiold Test: positive right.   Radiographs: None Assessment:   1. Plantar fasciitis of left foot   2. Plantar fasciitis of right foot        Plan:  Patient was evaluated and treated and all questions answered.  Right ankle capsulitis/dorsal foot edema -Clinically resolved   Plantar Fasciitis, bilateral - XR reviewed as above.  - Educated on icing and stretching. Instructions given.  -Second injection delivered to the plantar fascia as below. - DME: Plantar fascial brace dispensed to support the medial longitudinal arch of the foot and offload pressure from the heel and prevent arch collapse during weightbearing - Pharmacologic management: None  Procedure: Injection Tendon/Ligament Location: Left plantar fascia at the glabrous junction; medial approach. Skin Prep: alcohol Injectate: 0.5 cc 0.5% marcaine plain, 0.5 cc of 1% Lidocaine, 0.5 cc kenalog 10. Disposition: Patient tolerated procedure well. Injection site dressed with a band-aid.  No follow-ups on file.  No follow-ups on file.

## 2023-02-01 IMAGING — DX DG CHEST 2V
2 series · 2 of 2 positions shown · non-contrast
Comparison: 12/09/2020

CLINICAL DATA: Postop robot assisted hiatal hernia surgery.

EXAM:
CHEST - 2 VIEW

[dg chest 2 view (1 of 2)]
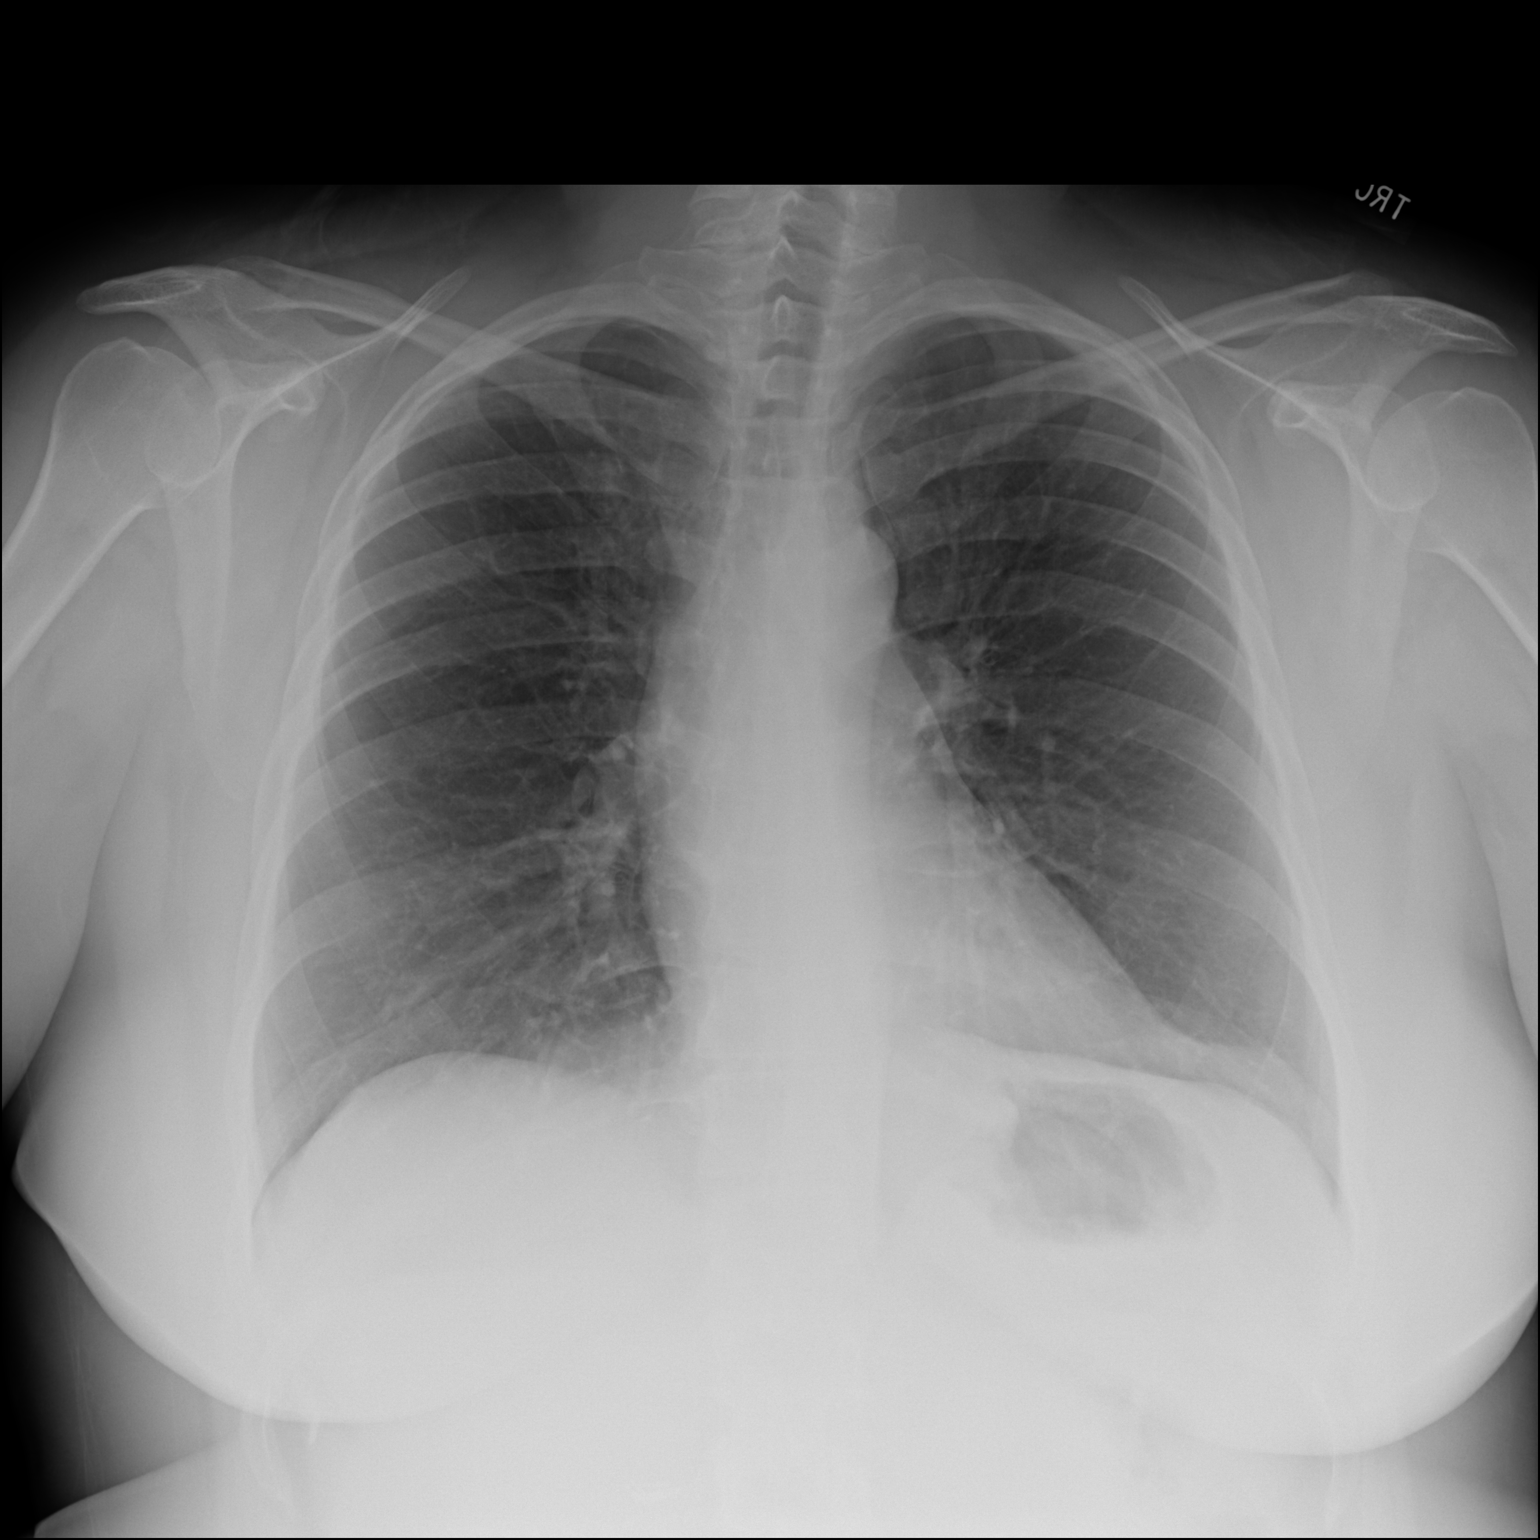

[dg chest 2 view (2 of 2)]
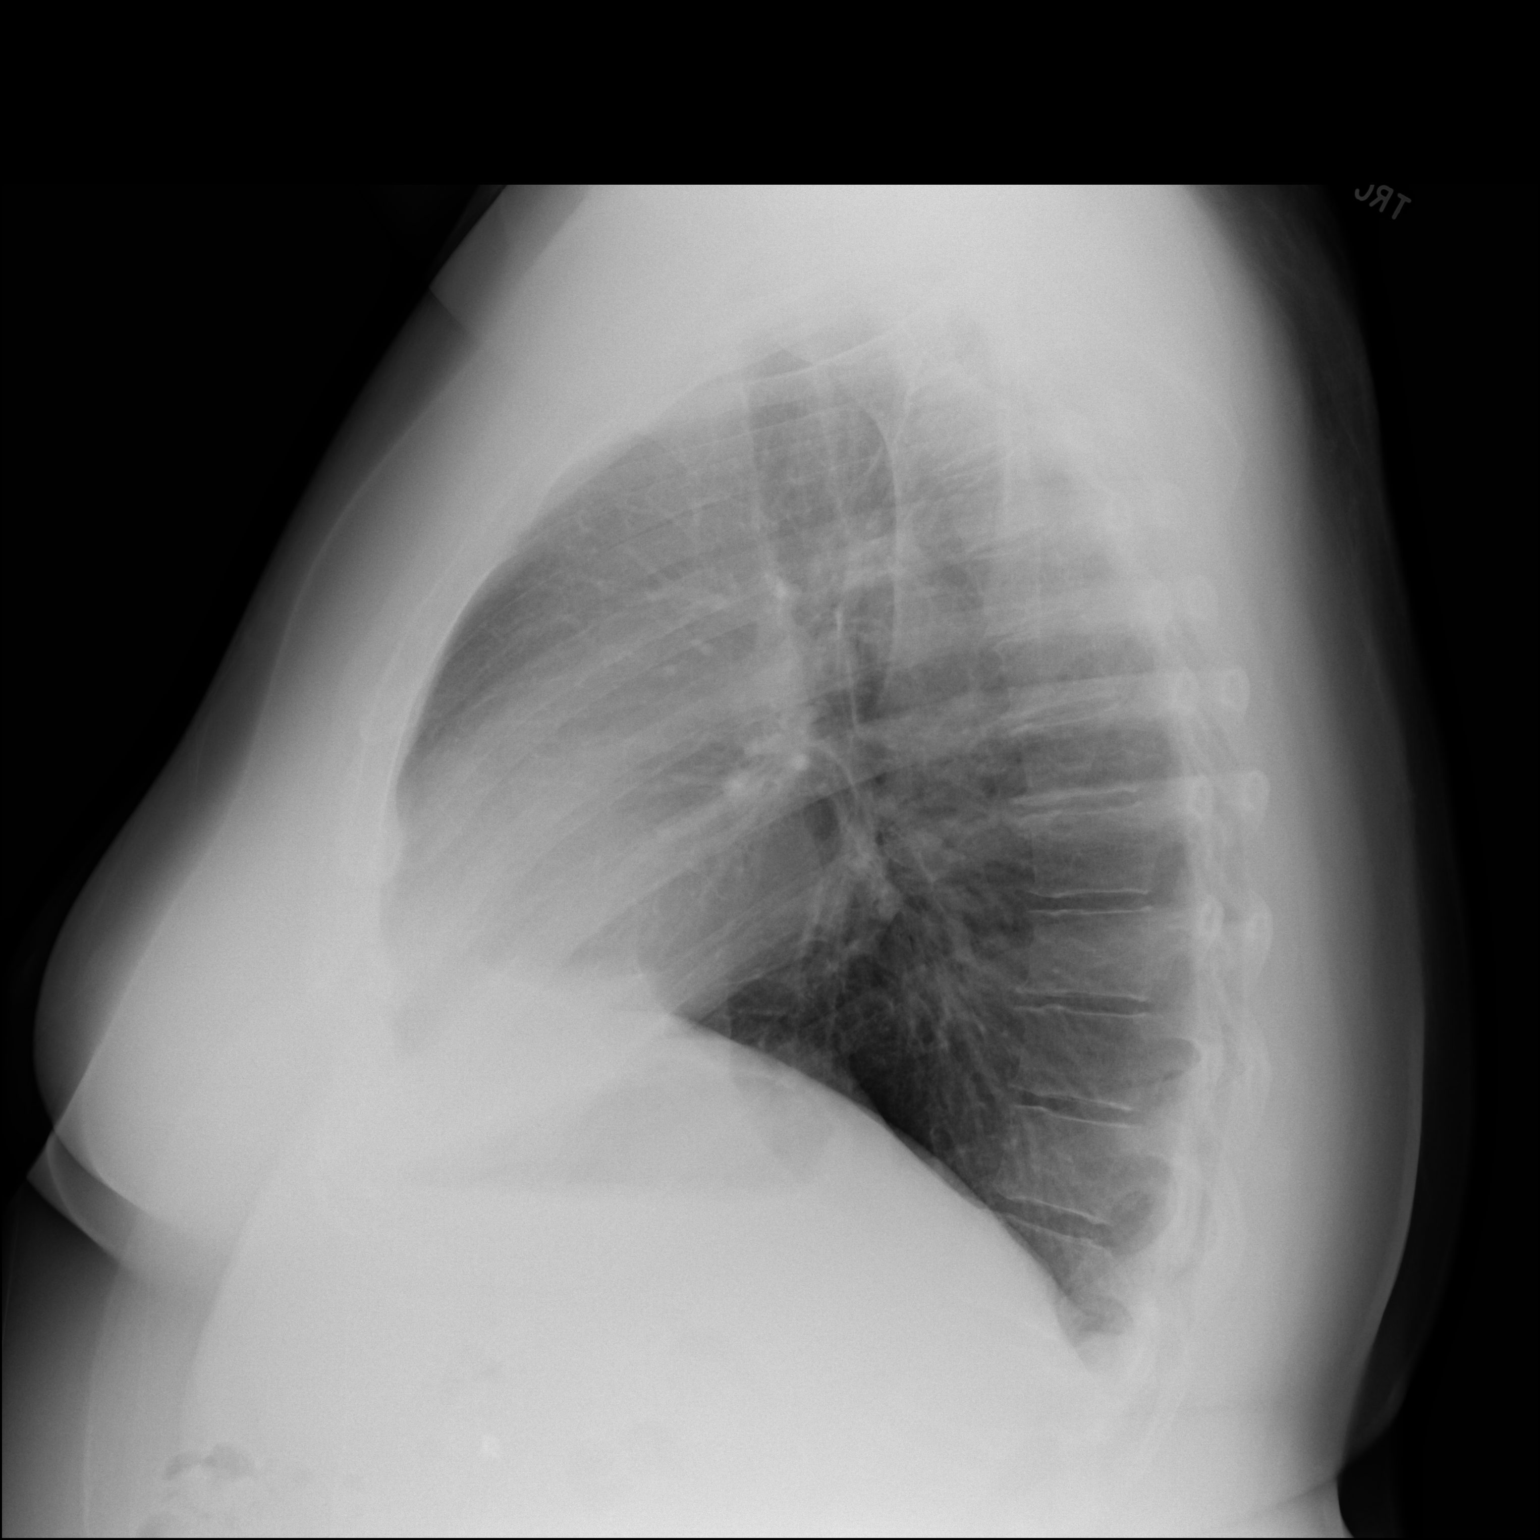

[2 of 2 positions shown; findings below may reference images not displayed]

FINDINGS: The heart size and mediastinal contours are within normal limits.
Both lungs are clear. The visualized skeletal structures are
unremarkable.
IMPRESSION: No active cardiopulmonary disease.

## 2023-06-14 ENCOUNTER — Other Ambulatory Visit: Payer: Self-pay

## 2023-06-14 ENCOUNTER — Emergency Department (HOSPITAL_COMMUNITY)
Admission: EM | Admit: 2023-06-14 | Discharge: 2023-06-14 | Disposition: A | Discharge status: Home / Self Care | Payer: Managed Care, Other (non HMO) | Attending: Emergency Medicine | Admitting: Emergency Medicine

## 2023-06-14 ENCOUNTER — Emergency Department (HOSPITAL_COMMUNITY): Payer: Managed Care, Other (non HMO)

## 2023-06-14 ENCOUNTER — Encounter (HOSPITAL_COMMUNITY): Payer: Self-pay | Admitting: *Deleted

## 2023-06-14 DIAGNOSIS — M25512 Pain in left shoulder: Secondary | ICD-10-CM | POA: Diagnosis not present

## 2023-06-14 DIAGNOSIS — Y92410 Unspecified street and highway as the place of occurrence of the external cause: Secondary | ICD-10-CM | POA: Diagnosis not present

## 2023-06-14 DIAGNOSIS — M542 Cervicalgia: Secondary | ICD-10-CM | POA: Diagnosis not present

## 2023-06-14 DIAGNOSIS — K449 Diaphragmatic hernia without obstruction or gangrene: Secondary | ICD-10-CM | POA: Insufficient documentation

## 2023-06-14 DIAGNOSIS — R079 Chest pain, unspecified: Secondary | ICD-10-CM | POA: Diagnosis present

## 2023-06-14 LAB — COMPREHENSIVE METABOLIC PANEL
ALT: 13 U/L (ref 0–44)
AST: 18 U/L (ref 15–41)
Albumin: 3.7 g/dL (ref 3.5–5.0)
Alkaline Phosphatase: 58 U/L (ref 38–126)
Anion gap: 9 (ref 5–15)
BUN: 12 mg/dL (ref 6–20)
CO2: 20 mmol/L — ABNORMAL LOW (ref 22–32)
Calcium: 9.1 mg/dL (ref 8.9–10.3)
Chloride: 106 mmol/L (ref 98–111)
Creatinine, Ser: 0.84 mg/dL (ref 0.44–1.00)
GFR, Estimated: >60 mL/min (ref >60)
Glucose, Bld: 87 mg/dL (ref 70–99)
Potassium: 3.8 mmol/L (ref 3.5–5.1)
Sodium: 135 mmol/L (ref 135–145)
Total Bilirubin: 0.5 mg/dL (ref <1.2)
Total Protein: 7.1 g/dL (ref 6.5–8.1)

## 2023-06-14 LAB — PREGNANCY, URINE: Preg Test, Ur: NEGATIVE

## 2023-06-14 MED ORDER — KETOROLAC TROMETHAMINE 15 MG/ML IJ SOLN
15.0000 mg | Freq: Once | INTRAMUSCULAR | Status: AC
  Administered 2023-06-14: 15 mg via INTRAVENOUS
  Filled 2023-06-14: qty 1

## 2023-06-14 MED ORDER — CYCLOBENZAPRINE HCL 10 MG PO TABS: 10.0000 mg | ORAL_TABLET | Freq: Two times a day (BID) | ORAL | 0 refills | Status: AC | PRN

## 2023-06-14 MED ORDER — IBUPROFEN 600 MG PO TABS: 600.0000 mg | ORAL_TABLET | Freq: Four times a day (QID) | ORAL | 0 refills | Status: AC | PRN

## 2023-06-14 MED ORDER — IOHEXOL 300 MG/ML  SOLN
75.0000 mL | Freq: Once | INTRAMUSCULAR | Status: AC | PRN
  Administered 2023-06-14: 75 mL via INTRAVENOUS
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
## 2023-06-14 NOTE — ED Notes (Signed)
Pain 6/10 C-collar removed Vitals listed

## 2023-06-14 NOTE — ED Notes (Signed)
Pt off to CT.

## 2023-06-14 NOTE — ED Notes (Signed)
Pt waiting for CT scan Pt ambulated to restroom wearing C-Collar
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
## 2023-06-14 NOTE — ED Notes (Signed)
Pt off to XRAY

## 2023-06-14 NOTE — Discharge Instructions (Addendum)
As discussed, workup today for reassuring.  CT imaging of your neck as well as your chest was without obvious abnormality such as fracture, collapsed lung, broken rib/sternum, etc. will recommend treatment of pain at home with anti-inflammatory as well as muscle laxer to use as needed.  Note the most laxer can cause drowsiness so please do not drive or perform any high risk activity and to realize this medications effects on you.  Expect pain to worsen over the next day or 2 before begins to get better.  Recommend follow-up with primary care for reassessment of your symptoms.  Please do not hesitate to return if the worrisome signs and symptoms we discussed become apparent.
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
## 2023-06-14 NOTE — ED Notes (Signed)
C-collar placed.

## 2023-06-14 NOTE — ED Notes (Signed)
Pt requested to eat Informed NPO until CT's have resulted

## 2023-06-14 NOTE — ED Notes (Addendum)
Reattached pt to partial monitor  Called CT No ETA on when they will scan pt

## 2023-06-24 ENCOUNTER — Telehealth: Payer: Self-pay
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
## 2023-07-16 ENCOUNTER — Institutional Professional Consult (permissible substitution) (INDEPENDENT_AMBULATORY_CARE_PROVIDER_SITE_OTHER): Payer: Managed Care, Other (non HMO) | Admitting: Thoracic Surgery (Cardiothoracic Vascular Surgery)

## 2023-07-16 VITALS — BP 139/92 | HR 102 | Resp 18 | Ht 64.0 in | Wt 297.0 lb

## 2023-07-16 DIAGNOSIS — K449 Diaphragmatic hernia without obstruction or gangrene: Secondary | ICD-10-CM

## 2023-08-13 ENCOUNTER — Ambulatory Visit (INDEPENDENT_AMBULATORY_CARE_PROVIDER_SITE_OTHER): Payer: Managed Care, Other (non HMO) | Admitting: Thoracic Surgery (Cardiothoracic Vascular Surgery)

## 2023-08-13 DIAGNOSIS — K449 Diaphragmatic hernia without obstruction or gangrene: Secondary | ICD-10-CM | POA: Diagnosis not present

## 2023-08-13 NOTE — Progress Notes (Signed)
     [address]       [place] [address]             [phone]       Patient: Home Provider: Office Consent for Telemedicine visit obtained.  Today's visit was completed via a real-time telehealth (see specific modality noted below). The patient/authorized person provided oral consent at the time of the visit to engage in a telemedicine encounter with the present provider at [facility]. The patient/authorized person was informed of the potential benefits, limitations, and risks of telemedicine. The patient/authorized person expressed understanding that the laws that protect confidentiality also apply to telemedicine. The patient/authorized person acknowledged understanding that telemedicine does not provide emergency services and that he or she would need to call 911 or proceed to the nearest hospital for help if such a need arose.   Total time spent in the clinical discussion 10 minutes.  Telehealth Modality: Phone visit (audio only)  I had a telephone visit with Mrs. Goldsmith.  Her pain is improving.  She would like to lose more weight prior to another surgery.  She is currently being seen at [facility] center.  She will call us  back with more details.  Braulio Kiedrowski MALVA Rayas

## 2023-10-14 ENCOUNTER — Ambulatory Visit: Admitting: Podiatry

## 2023-10-14 ENCOUNTER — Encounter: Payer: Self-pay | Admitting: Podiatry

## 2023-10-14 DIAGNOSIS — M722 Plantar fascial fibromatosis: Secondary | ICD-10-CM

## 2023-10-14 MED ORDER — METHYLPREDNISOLONE 4 MG PO TBPK
ORAL_TABLET | ORAL | 0 refills | Status: DC
Start: 2023-10-14 — End: 2023-10-15

## 2023-10-14 MED ORDER — CELECOXIB 200 MG PO CAPS: 200.0000 mg | ORAL_CAPSULE | Freq: Two times a day (BID) | ORAL | 3 refills | Status: DC

## 2023-10-14 MED ORDER — TRIAMCINOLONE ACETONIDE 40 MG/ML IJ SUSP
20.0000 mg | Freq: Once | INTRAMUSCULAR | Status: AC
  Administered 2023-10-14: 20 mg

## 2023-10-14 NOTE — Progress Notes (Signed)
 She presents today after having seen Dr. Allena Katz May of last year for plantar fasciitis of her left foot.  She states that we did surgery on the right foot many years ago and he has done pretty well though he occasionally gets painful.  Objective: Vital signs are stable she is alert and oriented x 3.  Pulses are palpable.  She has pain on the plantar medial and plantar central condyles of the left heel with palpation.  Assessment: Chronic intractable plantar fasciitis of the left foot.  Plan: Discussed etiology pathology conservative versus surgical therapies and I injected the left heel today 20 mg Kenalog, aggressive Marcaine point maximal tenderness.  We will start her on methylprednisolone to transition to Celebrex 200 mg 1 p.o. twice daily for 2 weeks then 1 p.o. daily after that.  I will follow-up with her in 4 to 6 weeks.  We did discuss appropriate shoe gear stretching exercises ice therapy and shoe gear modifications.

## 2023-10-15 ENCOUNTER — Other Ambulatory Visit: Payer: Self-pay

## 2023-10-15 MED ORDER — CELECOXIB 200 MG PO CAPS: 200.0000 mg | ORAL_CAPSULE | Freq: Two times a day (BID) | ORAL | 3 refills | Status: DC

## 2023-10-15 MED ORDER — METHYLPREDNISOLONE 4 MG PO TBPK: ORAL_TABLET | ORAL | 0 refills | Status: AC

## 2023-11-30 ENCOUNTER — Ambulatory Visit: Admitting: Podiatry

## 2024-01-25 ENCOUNTER — Other Ambulatory Visit: Payer: Self-pay

## 2024-01-25 DIAGNOSIS — M5416 Radiculopathy, lumbar region: Secondary | ICD-10-CM

## 2024-01-27 ENCOUNTER — Inpatient Hospital Stay
Admission: RE | Admit: 2024-01-27 | Discharge: 2024-01-27 | Discharge status: Home / Self Care | Source: Ambulatory Visit

## 2024-01-27 DIAGNOSIS — M5416 Radiculopathy, lumbar region: Secondary | ICD-10-CM

## 2024-02-29 ENCOUNTER — Other Ambulatory Visit: Payer: Self-pay | Admitting: Podiatry

## 2024-03-15 ENCOUNTER — Ambulatory Visit: Admitting: Podiatry

## 2024-03-15 DIAGNOSIS — M21961 Unspecified acquired deformity of right lower leg: Secondary | ICD-10-CM | POA: Diagnosis not present

## 2024-03-15 DIAGNOSIS — M21962 Unspecified acquired deformity of left lower leg: Secondary | ICD-10-CM | POA: Diagnosis not present

## 2024-03-15 NOTE — Progress Notes (Signed)
 Subjective:  Patient ID: Tiffany Chambers, female    DOB: 03/25/1976,  MRN: [id number]  Chief Complaint  Patient presents with   Plantar Fasciitis    Pt would like to talk about orthotics     48 y.o. female presents with the above complaint.  Patient presents with complaint of bilateral flatfoot deformity with calcaneovalgus to many toe signs partially to recruit the arch with dorsiflexion of the hallux.  Patient has a history of plantar fasciitis.  She wanted to do another pair of orthotics.  She has not seen and was prior to seeing me denies any other acute complaints.  She may get occasional discomfort in her arches and heels.   Review of Systems: Negative except as noted in the HPI. Denies N/V/F/Ch.  Past Medical History:  Diagnosis Date   Arthritis    back, hands   Depression    GERD (gastroesophageal reflux disease)    Headache    not since1/2021   Reflux     Current Outpatient Medications:    celecoxib  (CELEBREX ) 200 MG capsule, TAKE 1 CAPSULE(200 MG) BY MOUTH TWICE DAILY, Disp: 60 capsule, Rfl: 3   cetirizine -pseudoephedrine  (ZYRTEC -D) 5-120 MG tablet, Take 1 tablet by mouth daily., Disp: 30 tablet, Rfl: 0   citalopram  (CELEXA ) 20 MG tablet, Take 40 mg by mouth daily., Disp: , Rfl:    cyclobenzaprine  (FLEXERIL ) 10 MG tablet, Take 1 tablet (10 mg total) by mouth 2 (two) times daily as needed for muscle spasms., Disp: 20 tablet, Rfl: 0   diphenhydramine -acetaminophen  (TYLENOL  PM) 25-500 MG TABS tablet, Take 1 tablet by mouth at bedtime as needed., Disp: , Rfl:    drospirenone-ethinyl estradiol (YAZ) 3-0.02 MG tablet, Take by mouth., Disp: , Rfl:    fluticasone  (FLONASE ) 50 MCG/ACT nasal spray, Place 2 sprays into both nostrils daily., Disp: 16 g, Rfl: 0   ibuprofen  (ADVIL ) 600 MG tablet, Take 1 tablet (600 mg total) by mouth every 6 (six) hours as needed., Disp: 30 tablet, Rfl: 0   meloxicam  (MOBIC ) 15 MG tablet, Take 1 tablet (15 mg total) by mouth daily., Disp: 30  tablet, Rfl: 0   methylPREDNISolone  (MEDROL  DOSEPAK) 4 MG TBPK tablet, 6 day dose pack - take as directed, Disp: 21 tablet, Rfl: 0   Multiple Vitamin (MULTIVITAMIN ADULT PO), Take by mouth., Disp: , Rfl:    OVER THE COUNTER MEDICATION, Take 1 tablet by mouth daily. giambi, Disp: , Rfl:    topiramate (TOPAMAX) 50 MG tablet, Take 50 mg by mouth daily., Disp: , Rfl:    VITAMIN E PO, Take by mouth., Disp: , Rfl:  No current facility-administered medications for this visit.  Facility-Administered Medications Ordered in Other Visits:    hemostatic agents (no charge) [place], , , PRN, Mavis Anes, MD, 1 application  at 01/17/21 1315  Social History   Tobacco Use  Smoking Status Former   Types: Cigarettes  Smokeless Tobacco Never  Tobacco Comments   1 pack lasts one month or more    Allergies  Allergen Reactions   Iodine Itching   Dilaudid  [Hydromorphone  Hcl] Itching   Morphine And Codeine Itching   Objective:  There were no vitals filed for this visit. There is no height or weight on file to calculate BMI. Constitutional Well developed. Well nourished.  Vascular Dorsalis pedis pulses palpable bilaterally. Posterior tibial pulses palpable bilaterally. Capillary refill normal to all digits.  No cyanosis or clubbing noted. Pedal hair growth normal.  Neurologic Normal speech. Oriented to person, place,  and time. Epicritic sensation to light touch grossly present bilaterally.  Dermatologic Nails well groomed and normal in appearance. No open wounds. No skin lesions.  Orthopedic: Gait examination shows bilateral pes planovalgus deformity with calcaneovalgus to metatarsals partially buried.  The arch with dorsiflexion of the hallux unable to perform single and double heel raise.   Radiographs: None Assessment:   1. Foot deformity, bilateral    Plan:  Patient was evaluated and treated and all questions answered.  Pes planovalgus//foot deformity bilateral -I explained to  patient the etiology of pes planovalgus and relationship with Planter fasciitis and various treatment options were discussed.  Given patient foot structure in the setting of Planter fasciitis I believe patient will benefit from custom-made orthotics to help control the hindfoot motion support the arch of the foot and take the stress away from plantar fascial.  Patient agrees with the plan like to proceed with orthotics -Patient was casted for orthotics  -  No follow-ups on file.

## 2024-04-24 ENCOUNTER — Telehealth: Payer: Self-pay | Admitting: Podiatry

## 2024-04-24 NOTE — Telephone Encounter (Signed)
 Are orthotics here?

## 2024-06-12 ENCOUNTER — Other Ambulatory Visit

## 2024-06-16 ENCOUNTER — Ambulatory Visit

## 2024-06-16 DIAGNOSIS — M7751 Other enthesopathy of right foot: Secondary | ICD-10-CM

## 2024-06-16 DIAGNOSIS — M722 Plantar fascial fibromatosis: Secondary | ICD-10-CM

## 2024-06-16 DIAGNOSIS — M21961 Unspecified acquired deformity of right lower leg: Secondary | ICD-10-CM

## 2024-06-16 DIAGNOSIS — M21962 Unspecified acquired deformity of left lower leg: Secondary | ICD-10-CM

## 2024-06-16 NOTE — Progress Notes (Signed)
 Patient presents today to pick up custom molded foot orthotics recommended by Dr. Franky Blanch.   Orthotics were dispensed and fit was satisfactory. Reviewed instructions for break-in and wear. Written instructions given to patient.  Patient will follow up as needed.   [facility] - order # [id number]

## 2024-06-16 NOTE — Patient Instructions (Signed)
